# Patient Record
Sex: Female | Born: 1970 | Race: White | Hispanic: No | Marital: Married | Smoking: Never smoker
Health system: Southern US, Community
[De-identification: ages and names within clinical notes are randomized; demographics above are authoritative.]

## PROBLEM LIST (undated history)

## (undated) DIAGNOSIS — E785 Hyperlipidemia, unspecified: Secondary | ICD-10-CM

## (undated) DIAGNOSIS — I10 Essential (primary) hypertension: Secondary | ICD-10-CM

## (undated) DIAGNOSIS — F419 Anxiety disorder, unspecified: Secondary | ICD-10-CM

## (undated) HISTORY — PX: CHOLECYSTECTOMY: SHX55

## (undated) HISTORY — DX: Anxiety disorder, unspecified: F41.9

## (undated) HISTORY — DX: Hyperlipidemia, unspecified: E78.5

## (undated) HISTORY — PX: MASTOIDECTOMY: SHX711

## (undated) HISTORY — DX: Essential (primary) hypertension: I10

## (undated) HISTORY — PX: OTHER SURGICAL HISTORY: SHX169

## (undated) HISTORY — DX: Morbid (severe) obesity due to excess calories: E66.01

---

## 2002-03-22 ENCOUNTER — Ambulatory Visit (HOSPITAL_COMMUNITY): Admission: RE | Admit: 2002-03-22 | Discharge: 2002-03-22 | Payer: Self-pay | Admitting: Pulmonary Disease

## 2002-04-09 ENCOUNTER — Ambulatory Visit (HOSPITAL_COMMUNITY): Admission: RE | Admit: 2002-04-09 | Discharge: 2002-04-09 | Payer: Self-pay | Admitting: Pulmonary Disease

## 2002-04-30 ENCOUNTER — Observation Stay (HOSPITAL_COMMUNITY): Admission: RE | Admit: 2002-04-30 | Discharge: 2002-05-01 | Payer: Self-pay | Admitting: General Surgery

## 2006-07-17 ENCOUNTER — Emergency Department (HOSPITAL_COMMUNITY): Admission: EM | Admit: 2006-07-17 | Discharge: 2006-07-17 | Payer: Self-pay | Admitting: Emergency Medicine

## 2008-12-09 ENCOUNTER — Ambulatory Visit (HOSPITAL_COMMUNITY): Admission: RE | Admit: 2008-12-09 | Discharge: 2008-12-09 | Payer: Self-pay | Admitting: Pulmonary Disease

## 2008-12-17 ENCOUNTER — Ambulatory Visit (HOSPITAL_COMMUNITY): Admission: RE | Admit: 2008-12-17 | Discharge: 2008-12-17 | Payer: Self-pay | Admitting: Pulmonary Disease

## 2009-01-01 ENCOUNTER — Encounter (HOSPITAL_COMMUNITY): Admission: RE | Admit: 2009-01-01 | Discharge: 2009-01-31 | Payer: Self-pay | Admitting: Pulmonary Disease

## 2009-12-02 LAB — HM PAP SMEAR: HM Pap smear: NORMAL

## 2011-04-02 NOTE — Op Note (Signed)
[facility]  Patient:    Sharon Molina, Sharon Molina Visit Number: [id number] MRN: [id number]          Service Type: MED Location: 3A A321 01 Attending Physician:  Dalia Heading Dictated by:   Franky Macho, M.D. Proc. Date: 04/30/02 Admit Date:  04/30/2002 Discharge Date: 05/01/2002   CC:         Kari Baars, M.D.   Operative Report  PREOPERATIVE DIAGNOSES:   Chronic cholecystitis, ganglion cyst of right wrist.  POSTOPERATIVE DIAGNOSES:   Chronic cholecystitis, ganglion cyst of right wrist.  PROCEDURE:  Laparoscopic cholecystectomy, excision of ganglion cyst right wrist.  SURGEON:  Franky Macho, M.D.  ASSISTANT:  Arna Snipe, M.D.  ANESTHESIA:  General endotracheal.  INDICATIONS FOR PROCEDURE:  The patient is a 40 year old white female who presents with biliary colic secondary to chronic cholecystitis as well as a symptomatic right wrist ganglion cyst. The risks and benefits of both procedures including bleeding, infection, hepatobiliary injury, recurrence of the ganglion cyst, and the possibility of an open procedure were fully explained to the patient who gave informed consent.  DESCRIPTION OF PROCEDURE:  The patient was placed in the supine position. After induction of general endotracheal anesthesia, the right wrist was prepped and draped using the usual sterile technique with Betadine after confirmation of surgical site was performed.  A longitudinal incision was made over the ganglion cyst of the right wrist. This was taken down to the base of the ganglion cyst. A 5-0 Vicryl was placed along the base of the ganglion cyst and the ganglion cyst was excised without difficulty. A tourniquet was used at the beginning of the case. This was taken down and no significant bleeding was noted at the end of the procedure. The right radial artery was noted to be intact and pulsatile. The skin was closed using a 5-0 Vicryl subcuticular suture.  Steri-Strips and a dry sterile dressing were applied.  Next, the abdomen was prepped and draped using the usual sterile technique with Betadine. An infraumbilical incision was made down to the fascia. The Veress needle was introduced into the abdominal cavity and confirmation of placement was done using the saline drop test. The abdomen was then insufflated to 16 mmHg pressure. An 11 mm trocar was introduced into the abdominal cavity under direct visualization without visualization. The patient was placed in reverse Trendelenburg position and an additional 11 mm trocar was placed in the epigastric region and 5 mm trocars were placed in the right upper quadrant and right flank regions. The liver was inspected and noted to be within normal limits. The gallbladder was retracted superior and laterally. The dissection was begun around the infundibulum of the gallbladder. The cystic duct was first identified. Its juncture to the infundibulum fully identified. The endoclips were placed proximally and distally on the cystic duct and the cystic duct was divided. This was likewise done on the cystic artery. The gallbladder was then freed away from the gallbladder fossa using Bovie electrocautery. The gallbladder was delivered through the epigastric trocar site without difficulty. The gallbladder fossa was inspected and no abnormal bleeding or bile leakage was noted. All fluid and air were then evacuated from the abdominal cavity prior to removal of the trocars.  All wounds were irrigated with normal saline. All wounds were injected with 0.5% Sensorcaine. The infraumbilical fascia was reapproximated using an #0 Vicryl interrupted suture. The old skin incisions were closed using a 4-0 Vicryl subcuticular suture. Steri-Strips and dry sterile dressings were applied.  All tape and needle counts were correct at the end of the procedure. The patient was extubated in the operating room and went back to  the recovery room awake in stable condition.  COMPLICATIONS:  None.  SPECIMEN:  Gallbladder, ganglion cyst of right wrist.  ESTIMATED BLOOD LOSS:  Minimal.Dictated by:   Franky Macho, M.D.  Attending Physician:  Dalia Heading DD:  04/30/02 TD:  05/02/02 Job: [id number]

## 2011-04-02 NOTE — H&P (Signed)
[facility]  Patient:    Sharon Molina, Sharon Molina Visit Number: [id number] MRN: [id number]          Service Type: OUT Location: RAD Attending Physician:  Fredirick Maudlin Dictated by:   Franky Macho, M.D. Adm. Date:  04/30/02   CC:         Kari Baars, M.D.   History and Physical  AGE:  40 years old.  CHIEF COMPLAINT:  Chronic cholecystitis.  HISTORY OF PRESENT ILLNESS:  The patient is a 40 year old white female who is referred for evaluation and treatment of chronic cholecystitis.  She has been having intermittent episodes of right upper quadrant abdominal pain, nausea, bloating, and fatty food intolerance for many months.  It is now increasing in frequency and in nature.  No fever, chills, or jaundice have been noted.  PAST MEDICAL HISTORY:  Unremarkable.  PAST SURGICAL HISTORY:  Ear surgeries, and wisdom teeth removal.  CURRENT MEDICATIONS:  Birth control pills.  ALLERGIES:  SULFA DRUGS.  REVIEW OF SYSTEMS:  Unremarkable.  PHYSICAL EXAMINATION:  GENERAL:  On physical examination, the patient is a well-developed, well-nourished white female in no acute distress.  VITAL SIGNS:  She is afebrile and vital signs are stable.  HEENT:  No scleral icterus.  LUNGS:  Lungs are clear to auscultation with equal breath sounds bilaterally.  HEART:  Examination reveals a regular rate and rhythm without S3, S4, or murmurs.  ABDOMEN:  The abdomen is soft, nontender, and nondistended.  No hepatosplenomegaly, masses, or herniae are identified.  LABORATORY AND ACCESSORY DATA:  Ultrasound of the gallbladder was negative.  Hepatobiliary scan reveals chronic cholecystitis with a low gallbladder ejection fraction.  IMPRESSION:  Chronic cholecystitis.  PLAN:  The patient is scheduled for a laparoscopic cholecystectomy on April 30, 2002.  The risks and benefits of the procedure including bleeding, infection, hepatobiliary injury, and the  possibility of an open procedure were fully explained to the patient, who gave informed consent. Dictated by:   Franky Macho, M.D. Attending Physician:  Fredirick Maudlin DD:  04/13/02 TD:  04/13/02 Job: [id number]

## 2011-04-29 ENCOUNTER — Ambulatory Visit (INDEPENDENT_AMBULATORY_CARE_PROVIDER_SITE_OTHER): Payer: 59 | Admitting: Orthopedic Surgery

## 2011-04-29 ENCOUNTER — Encounter: Payer: Self-pay | Admitting: Orthopedic Surgery

## 2011-04-29 VITALS — Resp 18 | Ht 60.0 in | Wt 158.0 lb

## 2011-04-29 DIAGNOSIS — M629 Disorder of muscle, unspecified: Secondary | ICD-10-CM

## 2011-04-29 DIAGNOSIS — M7631 Iliotibial band syndrome, right leg: Secondary | ICD-10-CM | POA: Insufficient documentation

## 2011-04-29 NOTE — Progress Notes (Signed)
X-ray reports  AP lateral and tangential view patella RIGHT knee  The patella is well centered.  Overall joint alignment is normal.  No fracture or dislocation seen.  No joint effusion.  Impression normal knee.  Addendum we do see the slightest marginal spur of the medial tibial joint line

## 2011-04-29 NOTE — Patient Instructions (Signed)
Ice for 30 min after your shift  Max freeze 3 x a day apply to area  Take ibuprofen 800 mg 3 x a day for 2 weeks

## 2011-04-29 NOTE — Progress Notes (Signed)
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  It seems to be worse when she's walking up and down inclines or sudden change in position.  Of sitting and driving.  She reports swelling along the anterolateral portion of the knee just below the kneecap and just to the lateral side over the iliotibial band.  She reports some locking episodes.  There was no trauma but she was Hiking And after that things seemed to get worse.  Review of systems palpitations of the heart to heart burn no numbness no tingling no anxiety easy bruising or bleeding or shortness of breath  History recorded as above  Vital signs are stable as recorded  General appearance is normal  The patient is alert and oriented x3  The patient's mood and affect are normal  The patient is ambulating with a normal gait pattern  The cardiovascular exam reveals normal pulses and temperature without edema swelling.  The lymphatic system is negative for palpable lymph nodes  The sensory exam is normal.  There are no pathologic reflexes.  Balance is normal.  Body area: LEFT knee inspection normal.  Range of motion full.  Knee stability normal.  Strength normal.  Skin normal.  RIGHT knee inspection swelling and tenderness over GERD he is tubercle range of motion full, stability normal.Strength normal skin normal  Provocative tests McMurray sign negative bilaterally  Quadriceps active test normal bilaterally  Medial lateral facets nontender  X-rays were normal there is a marginal spur very small medial joint line.  Does not seem to be related to this illness or knee problem.  Impression iliotibial band syndrome.

## 2011-05-11 ENCOUNTER — Ambulatory Visit (HOSPITAL_COMMUNITY)
Admission: RE | Admit: 2011-05-11 | Discharge: 2011-05-11 | Disposition: A | Discharge status: Home / Self Care | Payer: 59 | Source: Ambulatory Visit | Attending: Orthopedic Surgery | Admitting: Orthopedic Surgery

## 2011-05-11 DIAGNOSIS — M25669 Stiffness of unspecified knee, not elsewhere classified: Secondary | ICD-10-CM | POA: Insufficient documentation

## 2011-05-11 DIAGNOSIS — R262 Difficulty in walking, not elsewhere classified: Secondary | ICD-10-CM | POA: Insufficient documentation

## 2011-05-11 DIAGNOSIS — M25469 Effusion, unspecified knee: Secondary | ICD-10-CM | POA: Insufficient documentation

## 2011-05-11 DIAGNOSIS — M25569 Pain in unspecified knee: Secondary | ICD-10-CM | POA: Insufficient documentation

## 2011-05-11 DIAGNOSIS — M6281 Muscle weakness (generalized): Secondary | ICD-10-CM | POA: Insufficient documentation

## 2011-05-11 DIAGNOSIS — IMO0001 Reserved for inherently not codable concepts without codable children: Secondary | ICD-10-CM | POA: Insufficient documentation

## 2011-05-13 ENCOUNTER — Ambulatory Visit (INDEPENDENT_AMBULATORY_CARE_PROVIDER_SITE_OTHER): Payer: 59 | Admitting: Orthopedic Surgery

## 2011-05-13 ENCOUNTER — Encounter: Payer: Self-pay | Admitting: Orthopedic Surgery

## 2011-05-13 ENCOUNTER — Ambulatory Visit (HOSPITAL_COMMUNITY)
Admission: RE | Admit: 2011-05-13 | Discharge: 2011-05-13 | Disposition: A | Discharge status: Home / Self Care | Payer: 59 | Source: Ambulatory Visit | Attending: *Deleted | Admitting: *Deleted

## 2011-05-13 DIAGNOSIS — M25569 Pain in unspecified knee: Secondary | ICD-10-CM

## 2011-05-13 NOTE — Patient Instructions (Signed)
You have received a steroid shot. 15% of patients experience increased pain at the injection site with in the next 24 hours. This is best treated with ice and tylenol extra strength 2 tabs every 8 hours. If you are still having pain please call the office.    

## 2011-05-17 ENCOUNTER — Encounter: Payer: Self-pay | Admitting: Orthopedic Surgery

## 2011-05-17 NOTE — Progress Notes (Signed)
Impression iliotibial band syndrome.4/anterior knee pain syndrome Treatment with physical therapy and oral anti-inflammatories  Patient  continues to have pain in the front of the knee associated with stair climbing and knee bending  After examination and evaluation shot was placed into the RIGHT knee joint.  Verbal consent was obtained   Time out completed   Under sterile conditions the right knee was injected with Depomedrol 40 mg / ml (1 ml) and lidocaine 1% (4 ml)  There were no complications  Continue therapy

## 2011-05-18 ENCOUNTER — Ambulatory Visit (HOSPITAL_COMMUNITY)
Admission: RE | Admit: 2011-05-18 | Discharge: 2011-05-18 | Disposition: A | Discharge status: Home / Self Care | Payer: 59 | Source: Ambulatory Visit | Attending: Orthopedic Surgery | Admitting: Orthopedic Surgery

## 2011-05-18 DIAGNOSIS — M25469 Effusion, unspecified knee: Secondary | ICD-10-CM | POA: Insufficient documentation

## 2011-05-18 DIAGNOSIS — IMO0001 Reserved for inherently not codable concepts without codable children: Secondary | ICD-10-CM | POA: Insufficient documentation

## 2011-05-18 DIAGNOSIS — M25569 Pain in unspecified knee: Secondary | ICD-10-CM | POA: Insufficient documentation

## 2011-05-18 DIAGNOSIS — R262 Difficulty in walking, not elsewhere classified: Secondary | ICD-10-CM | POA: Insufficient documentation

## 2011-05-18 DIAGNOSIS — M25669 Stiffness of unspecified knee, not elsewhere classified: Secondary | ICD-10-CM | POA: Insufficient documentation

## 2011-05-18 DIAGNOSIS — M6281 Muscle weakness (generalized): Secondary | ICD-10-CM | POA: Insufficient documentation

## 2011-05-20 ENCOUNTER — Ambulatory Visit (HOSPITAL_COMMUNITY): Payer: 59 | Admitting: Physical Therapy

## 2011-06-02 ENCOUNTER — Encounter: Payer: Self-pay | Admitting: Family Medicine

## 2011-06-08 ENCOUNTER — Ambulatory Visit (INDEPENDENT_AMBULATORY_CARE_PROVIDER_SITE_OTHER): Payer: 59 | Admitting: Orthopedic Surgery

## 2011-06-08 DIAGNOSIS — M25569 Pain in unspecified knee: Secondary | ICD-10-CM

## 2011-06-08 NOTE — Progress Notes (Signed)
Followup  Anterior knee pain.  Improved after therapy, and intra-articular injection.  The exam today, normal.  Follow up as needed

## 2011-09-23 ENCOUNTER — Other Ambulatory Visit (HOSPITAL_COMMUNITY): Payer: Self-pay | Admitting: Pulmonary Disease

## 2011-09-23 DIAGNOSIS — Z139 Encounter for screening, unspecified: Secondary | ICD-10-CM

## 2011-09-30 ENCOUNTER — Ambulatory Visit (HOSPITAL_COMMUNITY)
Admission: RE | Admit: 2011-09-30 | Discharge: 2011-09-30 | Disposition: A | Discharge status: Home / Self Care | Payer: 59 | Source: Ambulatory Visit | Attending: Pulmonary Disease | Admitting: Pulmonary Disease

## 2011-09-30 DIAGNOSIS — Z1231 Encounter for screening mammogram for malignant neoplasm of breast: Secondary | ICD-10-CM | POA: Insufficient documentation

## 2011-09-30 DIAGNOSIS — Z139 Encounter for screening, unspecified: Secondary | ICD-10-CM

## 2011-10-08 ENCOUNTER — Other Ambulatory Visit: Payer: Self-pay | Admitting: Pulmonary Disease

## 2011-10-08 DIAGNOSIS — R928 Other abnormal and inconclusive findings on diagnostic imaging of breast: Secondary | ICD-10-CM

## 2011-11-03 ENCOUNTER — Ambulatory Visit (HOSPITAL_COMMUNITY)
Admission: RE | Admit: 2011-11-03 | Discharge: 2011-11-03 | Disposition: A | Discharge status: Home / Self Care | Payer: 59 | Source: Ambulatory Visit | Attending: Pulmonary Disease | Admitting: Pulmonary Disease

## 2011-11-03 ENCOUNTER — Other Ambulatory Visit: Payer: Self-pay | Admitting: Pulmonary Disease

## 2011-11-03 ENCOUNTER — Other Ambulatory Visit (HOSPITAL_COMMUNITY): Payer: Self-pay | Admitting: Pulmonary Disease

## 2011-11-03 DIAGNOSIS — R928 Other abnormal and inconclusive findings on diagnostic imaging of breast: Secondary | ICD-10-CM

## 2011-11-03 DIAGNOSIS — N6009 Solitary cyst of unspecified breast: Secondary | ICD-10-CM | POA: Insufficient documentation

## 2012-04-17 ENCOUNTER — Other Ambulatory Visit (HOSPITAL_COMMUNITY)
Admission: RE | Admit: 2012-04-17 | Discharge: 2012-04-17 | Disposition: A | Discharge status: Home / Self Care | Payer: 59 | Source: Ambulatory Visit | Attending: Obstetrics & Gynecology | Admitting: Obstetrics & Gynecology

## 2012-04-17 ENCOUNTER — Other Ambulatory Visit: Payer: Self-pay | Admitting: Obstetrics & Gynecology

## 2012-04-17 DIAGNOSIS — Z01419 Encounter for gynecological examination (general) (routine) without abnormal findings: Secondary | ICD-10-CM | POA: Insufficient documentation

## 2013-05-02 ENCOUNTER — Other Ambulatory Visit (HOSPITAL_BASED_OUTPATIENT_CLINIC_OR_DEPARTMENT_OTHER): Payer: Self-pay | Admitting: Pulmonary Disease

## 2013-05-02 DIAGNOSIS — Z139 Encounter for screening, unspecified: Secondary | ICD-10-CM

## 2013-05-08 ENCOUNTER — Ambulatory Visit (HOSPITAL_COMMUNITY)
Admission: RE | Admit: 2013-05-08 | Discharge: 2013-05-08 | Disposition: A | Discharge status: Home / Self Care | Payer: 59 | Source: Ambulatory Visit | Attending: Pulmonary Disease | Admitting: Pulmonary Disease

## 2013-05-08 DIAGNOSIS — Z139 Encounter for screening, unspecified: Secondary | ICD-10-CM

## 2013-05-08 DIAGNOSIS — Z1231 Encounter for screening mammogram for malignant neoplasm of breast: Secondary | ICD-10-CM | POA: Insufficient documentation

## 2013-06-13 IMAGING — US US BREAST*R*
1 series · 4 of 4 positions shown · non-contrast
Comparison: Screening mammogram dated 09/30/2011

CLINICAL DATA: Abnormal right screening mammogram

DIGITAL DIAGNOSTIC RIGHT MAMMOGRAM  AND RIGHT BREAST ULTRASOUND:

[Series 1: us breast*right* · 0.09mm/px · 4 of 4 slices shown]
[im 1/4]
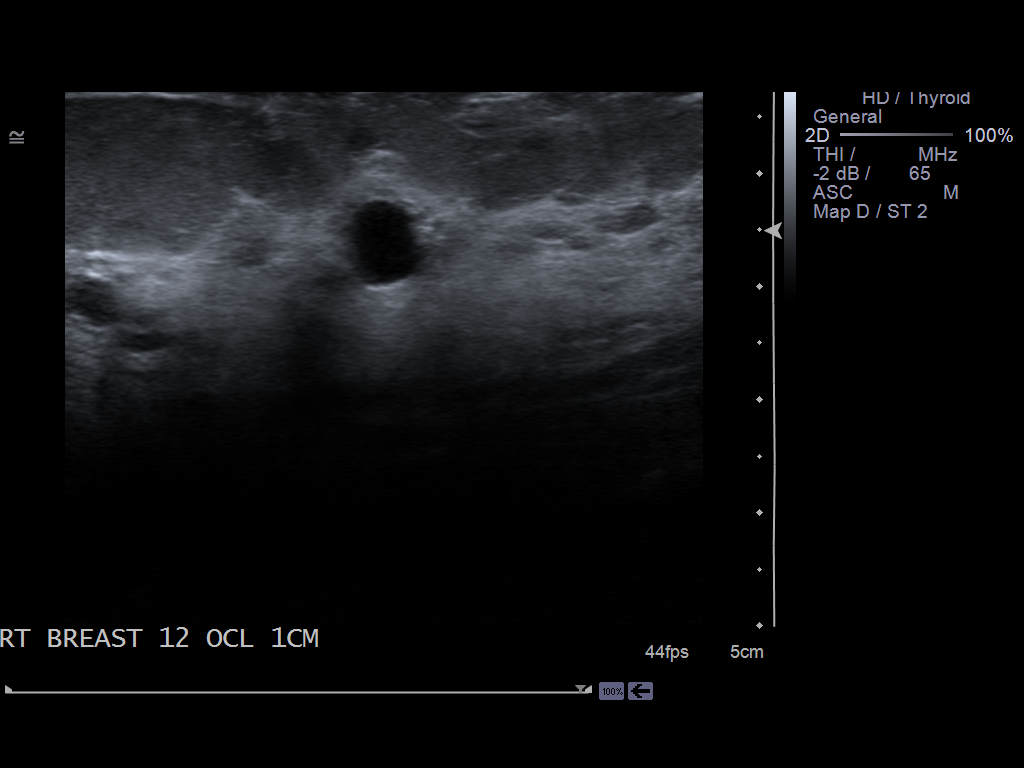
[im 2/4]
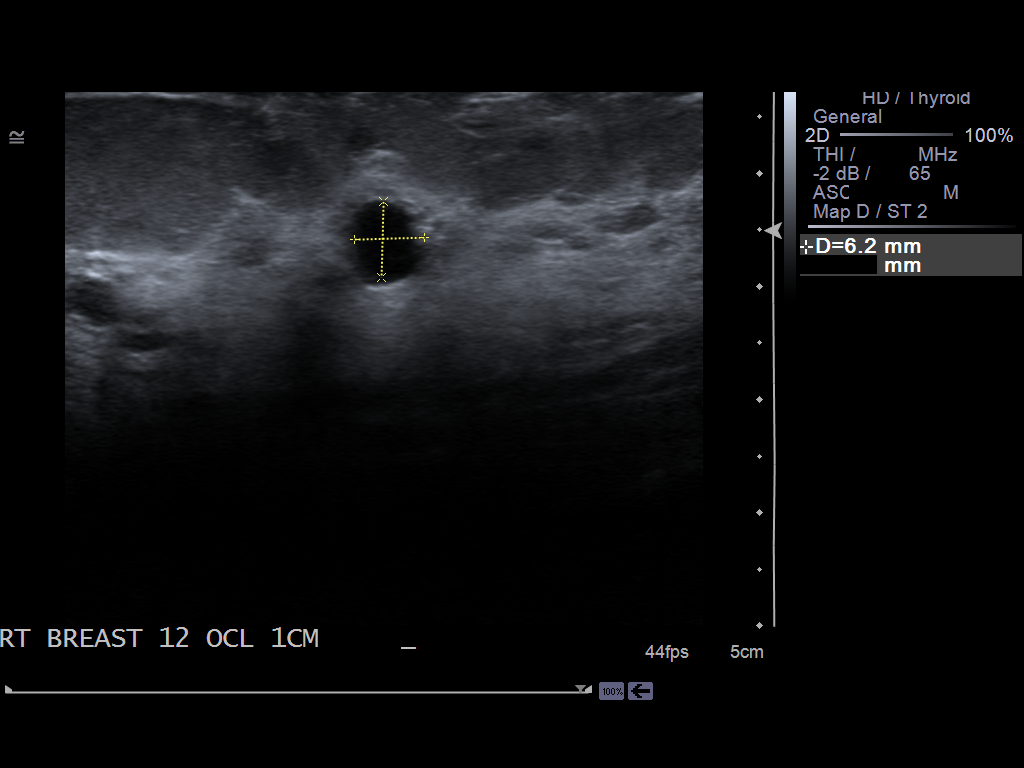
[im 3/4]
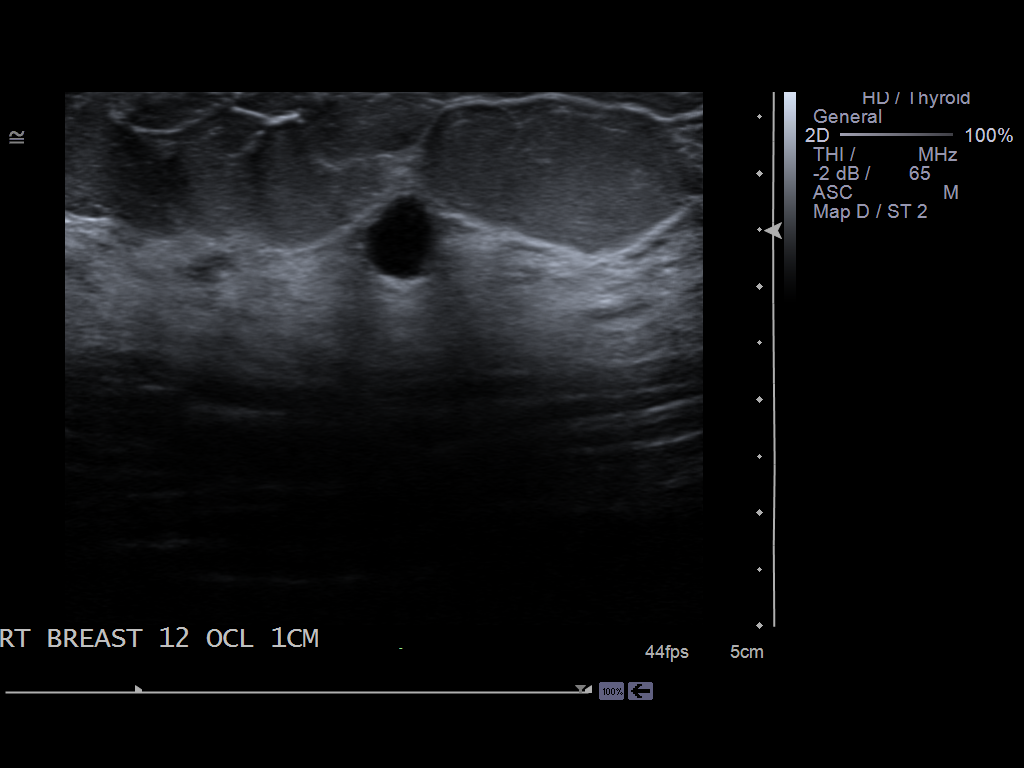
[im 4/4]
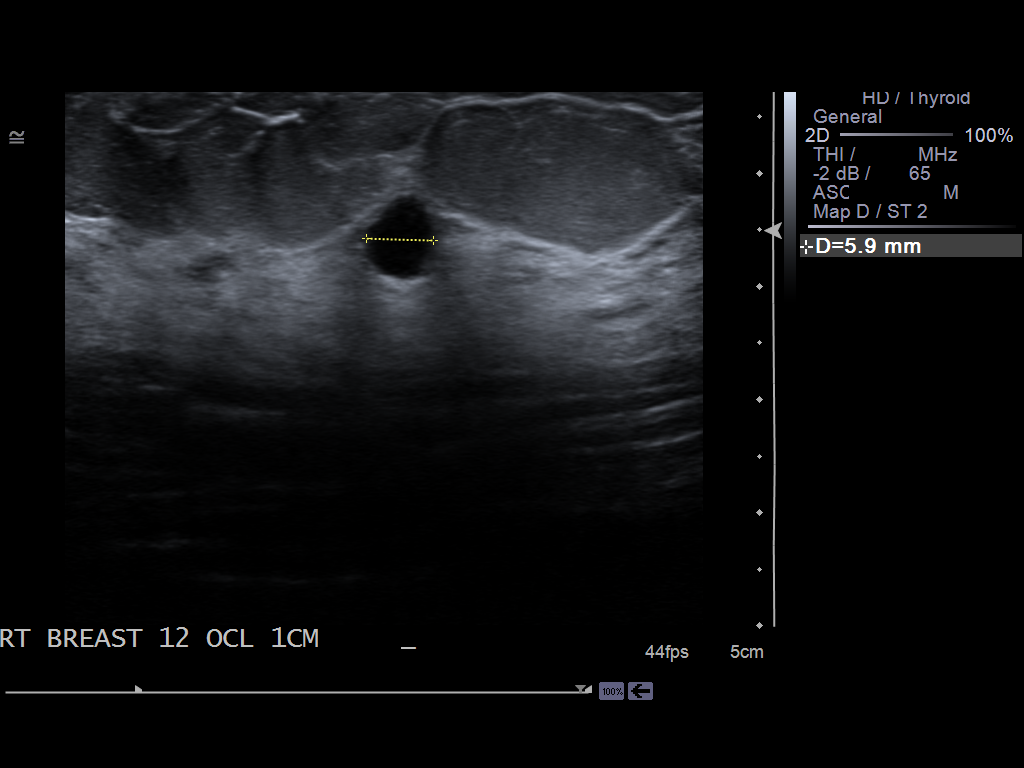

[4 of 4 positions shown; findings below may reference images not displayed]

FINDINGS: Spot compression views of the 12 o'clock subareolar
region of the right breast were performed.  There is persistence of
an obscured 11 mm mass.  There is no associated malignant-type
microcalcifications.

On physical exam, I do not palpate a mass in the right breast.

Ultrasound is performed, showing there is a simple cyst in the
right breast at 12 o'clock 1 cm from the nipple measuring 6 x 7 x 6
mm.  There is no solid mass.
IMPRESSION: Right breast cyst.  No evidence of malignancy.  Screening mammogram
in 1 year is recommended.

BI-RADS CATEGORY 2:  Benign finding(s).

## 2014-05-21 ENCOUNTER — Encounter: Payer: Self-pay | Admitting: Obstetrics & Gynecology

## 2014-05-21 ENCOUNTER — Other Ambulatory Visit (HOSPITAL_COMMUNITY)
Admission: RE | Admit: 2014-05-21 | Discharge: 2014-05-21 | Disposition: A | Discharge status: Home / Self Care | Payer: 59 | Source: Ambulatory Visit | Attending: Obstetrics & Gynecology | Admitting: Obstetrics & Gynecology

## 2014-05-21 ENCOUNTER — Ambulatory Visit (INDEPENDENT_AMBULATORY_CARE_PROVIDER_SITE_OTHER): Payer: 59 | Admitting: Obstetrics & Gynecology

## 2014-05-21 VITALS — BP 120/90 | Ht 59.4 in | Wt 169.0 lb

## 2014-05-21 DIAGNOSIS — Z01419 Encounter for gynecological examination (general) (routine) without abnormal findings: Secondary | ICD-10-CM | POA: Insufficient documentation

## 2014-05-21 DIAGNOSIS — Z1151 Encounter for screening for human papillomavirus (HPV): Secondary | ICD-10-CM | POA: Insufficient documentation

## 2014-05-21 NOTE — Progress Notes (Signed)
Patient ID: Sharon Rothmanammy C Steinhoff, female   DOB: Nov 03, 1971, 43 y.o.   MRN: [id number] Subjective:     Sharon Molina is a 43 y.o. female here for a routine exam.  Patient's last menstrual period was 05/01/2014. No obstetric history on file. Birth Control Method:  Vasectomy  Menstrual Calendar(currently): regular  Current complaints: none.   Current acute medical issues:  none   Recent Gynecologic History Patient's last menstrual period was 05/01/2014. Last Pap: 2014,  normal Last mammogram: 04/2013,  normal  History reviewed. No pertinent past medical history.  Past Surgical History  Procedure Laterality Date  . Tubes in ears    . Mastoidectomy      OB History   Grav Para Term Preterm Abortions TAB SAB Ect Mult Living                  History   Social History  . Marital Status: Married    Spouse Name: N/A    Number of Children: N/A  . Years of Education: N/A   Occupational History  . RN    Social History Main Topics  . Smoking status: Never Smoker   . Smokeless tobacco: None  . Alcohol Use: No  . Drug Use: No  . Sexual Activity: None   Other Topics Concern  . None   Social History Narrative  . None    Family History  Problem Relation Age of Onset  . Heart disease    . Cancer    . Hypertension Father   . Parkinsonism Father   . Kidney cancer Father   . Hypertension Mother      Review of Systems  Review of Systems  Constitutional: Negative for fever, chills, weight loss, malaise/fatigue and diaphoresis.  HENT: Negative for hearing loss, ear pain, nosebleeds, congestion, sore throat, neck pain, tinnitus and ear discharge.   Eyes: Negative for blurred vision, double vision, photophobia, pain, discharge and redness.  Respiratory: Negative for cough, hemoptysis, sputum production, shortness of breath, wheezing and stridor.   Cardiovascular: Negative for chest pain, palpitations, orthopnea, claudication, leg swelling and PND.  Gastrointestinal: negative  for abdominal pain. Negative for heartburn, nausea, vomiting, diarrhea, constipation, blood in stool and melena.  Genitourinary: Negative for dysuria, urgency, frequency, hematuria and flank pain.  Musculoskeletal: Negative for myalgias, back pain, joint pain and falls.  Skin: Negative for itching and rash.  Neurological: Negative for dizziness, tingling, tremors, sensory change, speech change, focal weakness, seizures, loss of consciousness, weakness and headaches.  Endo/Heme/Allergies: Negative for environmental allergies and polydipsia. Does not bruise/bleed easily.  Psychiatric/Behavioral: Negative for depression, suicidal ideas, hallucinations, memory loss and substance abuse. The patient is not nervous/anxious and does not have insomnia.        Objective:    Physical Exam  Vitals reviewed. Constitutional: She is oriented to person, place, and time. She appears well-developed and well-nourished.  HENT:  Head: Normocephalic and atraumatic.        Right Ear: External ear normal.  Left Ear: External ear normal.  Nose: Nose normal.  Mouth/Throat: Oropharynx is clear and moist.  Eyes: Conjunctivae and EOM are normal. Pupils are equal, round, and reactive to light. Right eye exhibits no discharge. Left eye exhibits no discharge. No scleral icterus.  Neck: Normal range of motion. Neck supple. No tracheal deviation present. No thyromegaly present.  Cardiovascular: Normal rate, regular rhythm, normal heart sounds and intact distal pulses.  Exam reveals no gallop and no friction rub.   No murmur heard.  Respiratory: Effort normal and breath sounds normal. No respiratory distress. She has no wheezes. She has no rales. She exhibits no tenderness.  GI: Soft. Bowel sounds are normal. She exhibits no distension and no mass. There is no tenderness. There is no rebound and no guarding.  Genitourinary:  Breasts no masses skin changes or nipple changes bilaterally      Vulva is normal without  lesions Vagina is pink moist without discharge Cervix normal in appearance and pap is done Uterus is normal size shape and contour Adnexa is negative with normal sized ovaries    Musculoskeletal: Normal range of motion. She exhibits no edema and no tenderness.  Neurological: She is alert and oriented to person, place, and time. She has normal reflexes. She displays normal reflexes. No cranial nerve deficit. She exhibits normal muscle tone. Coordination normal.  Skin: Skin is warm and dry. No rash noted. No erythema. No pallor.  Psychiatric: She has a normal mood and affect. Her behavior is normal. Judgment and thought content normal.       Assessment:    Healthy female exam.    Plan:    Follow up in: 1 year.

## 2014-05-22 LAB — CYTOLOGY - PAP

## 2015-06-05 ENCOUNTER — Other Ambulatory Visit: Payer: Self-pay | Admitting: Obstetrics & Gynecology

## 2015-06-05 DIAGNOSIS — Z1231 Encounter for screening mammogram for malignant neoplasm of breast: Secondary | ICD-10-CM

## 2015-06-11 ENCOUNTER — Ambulatory Visit (HOSPITAL_COMMUNITY)
Admission: RE | Admit: 2015-06-11 | Discharge: 2015-06-11 | Disposition: A | Discharge status: Home / Self Care | Payer: 59 | Source: Ambulatory Visit | Attending: Obstetrics & Gynecology | Admitting: Obstetrics & Gynecology

## 2015-06-11 DIAGNOSIS — Z1231 Encounter for screening mammogram for malignant neoplasm of breast: Secondary | ICD-10-CM | POA: Diagnosis not present

## 2015-06-27 ENCOUNTER — Other Ambulatory Visit: Payer: 59 | Admitting: Obstetrics & Gynecology

## 2015-07-10 ENCOUNTER — Encounter: Payer: Self-pay | Admitting: Obstetrics & Gynecology

## 2015-07-10 ENCOUNTER — Other Ambulatory Visit (HOSPITAL_COMMUNITY)
Admission: RE | Admit: 2015-07-10 | Discharge: 2015-07-10 | Disposition: A | Discharge status: Home / Self Care | Payer: 59 | Source: Ambulatory Visit | Attending: Obstetrics & Gynecology | Admitting: Obstetrics & Gynecology

## 2015-07-10 ENCOUNTER — Ambulatory Visit (INDEPENDENT_AMBULATORY_CARE_PROVIDER_SITE_OTHER): Payer: 59 | Admitting: Obstetrics & Gynecology

## 2015-07-10 VITALS — BP 140/80 | HR 72 | Ht 59.0 in | Wt 165.0 lb

## 2015-07-10 DIAGNOSIS — Z01419 Encounter for gynecological examination (general) (routine) without abnormal findings: Secondary | ICD-10-CM | POA: Diagnosis not present

## 2015-07-10 DIAGNOSIS — Z1151 Encounter for screening for human papillomavirus (HPV): Secondary | ICD-10-CM | POA: Insufficient documentation

## 2015-07-10 DIAGNOSIS — Z01411 Encounter for gynecological examination (general) (routine) with abnormal findings: Secondary | ICD-10-CM | POA: Insufficient documentation

## 2015-07-10 NOTE — Progress Notes (Signed)
Patient ID: Sharon Molina, female   DOB: 26-Mar-1971, 44 y.o.   MRN: [id number] Subjective:     Sharon Molina is a 44 y.o. female here for a routine exam.  Patient's last menstrual period was 06/25/2015. No obstetric history on file. Birth Control Method:  vasectomy Menstrual Calendar(currently): regular  Current complaints: no problems.   Current acute medical issues:  none   Recent Gynecologic History Patient's last menstrual period was 06/25/2015. Last Pap: 2015,  normal Last mammogram: 8/16,  normal  History reviewed. No pertinent past medical history.  Past Surgical History  Procedure Laterality Date  . Tubes in ears    . Mastoidectomy      OB History    No data available      Social History   Social History  . Marital Status: Married    Spouse Name: N/A  . Number of Children: N/A  . Years of Education: N/A   Occupational History  . RN    Social History Main Topics  . Smoking status: Never Smoker   . Smokeless tobacco: None  . Alcohol Use: No  . Drug Use: No  . Sexual Activity: Not Asked   Other Topics Concern  . None   Social History Narrative    Family History  Problem Relation Age of Onset  . Heart disease    . Cancer    . Hypertension Father   . Parkinsonism Father   . Kidney cancer Father   . Hypertension Mother     No current outpatient prescriptions on file.  Review of Systems  Review of Systems  Constitutional: Negative for fever, chills, weight loss, malaise/fatigue and diaphoresis.  HENT: Negative for hearing loss, ear pain, nosebleeds, congestion, sore throat, neck pain, tinnitus and ear discharge.   Eyes: Negative for blurred vision, double vision, photophobia, pain, discharge and redness.  Respiratory: Negative for cough, hemoptysis, sputum production, shortness of breath, wheezing and stridor.   Cardiovascular: Negative for chest pain, palpitations, orthopnea, claudication, leg swelling and PND.  Gastrointestinal:  negative for abdominal pain. Negative for heartburn, nausea, vomiting, diarrhea, constipation, blood in stool and melena.  Genitourinary: Negative for dysuria, urgency, frequency, hematuria and flank pain.  Musculoskeletal: Negative for myalgias, back pain, joint pain and falls.  Skin: Negative for itching and rash.  Neurological: Negative for dizziness, tingling, tremors, sensory change, speech change, focal weakness, seizures, loss of consciousness, weakness and headaches.  Endo/Heme/Allergies: Negative for environmental allergies and polydipsia. Does not bruise/bleed easily.  Psychiatric/Behavioral: Negative for depression, suicidal ideas, hallucinations, memory loss and substance abuse. The patient is not nervous/anxious and does not have insomnia.        Objective:  Blood pressure 140/80, pulse 72, height  (1.499 m), weight 165 lb (74.844 kg), last menstrual period 06/25/2015.   Physical Exam  Vitals reviewed. Constitutional: She is oriented to person, place, and time. She appears well-developed and well-nourished.  HENT:  Head: Normocephalic and atraumatic.        Right Ear: External ear normal.  Left Ear: External ear normal.  Nose: Nose normal.  Mouth/Throat: Oropharynx is clear and moist.  Eyes: Conjunctivae and EOM are normal. Pupils are equal, round, and reactive to light. Right eye exhibits no discharge. Left eye exhibits no discharge. No scleral icterus.  Neck: Normal range of motion. Neck supple. No tracheal deviation present. No thyromegaly present.  Cardiovascular: Normal rate, regular rhythm, normal heart sounds and intact distal pulses.  Exam reveals no gallop and no friction rub.  No murmur heard. Respiratory: Effort normal and breath sounds normal. No respiratory distress. She has no wheezes. She has no rales. She exhibits no tenderness.  GI: Soft. Bowel sounds are normal. She exhibits no distension and no mass. There is no tenderness. There is no rebound and no  guarding.  Genitourinary:  Breasts no masses skin changes or nipple changes bilaterally      Vulva is normal without lesions Vagina is pink moist without discharge Cervix normal in appearance and pap is done Uterus is normal size shape and contour Adnexa is negative with normal sized ovaries   Musculoskeletal: Normal range of motion. She exhibits no edema and no tenderness.  Neurological: She is alert and oriented to person, place, and time. She has normal reflexes. She displays normal reflexes. No cranial nerve deficit. She exhibits normal muscle tone. Coordination normal.  Skin: Skin is warm and dry. No rash noted. No erythema. No pallor.  Psychiatric: She has a normal mood and affect. Her behavior is normal. Judgment and thought content normal.       Assessment:    Healthy female exam.    Plan:    Contraception: vasectomy. Mammogram ordered. Follow up in: 1 years.

## 2015-07-14 LAB — CYTOLOGY - PAP

## 2015-11-26 DIAGNOSIS — H7011 Chronic mastoiditis, right ear: Secondary | ICD-10-CM | POA: Diagnosis not present

## 2016-08-18 ENCOUNTER — Ambulatory Visit (INDEPENDENT_AMBULATORY_CARE_PROVIDER_SITE_OTHER): Payer: Self-pay | Admitting: Obstetrics and Gynecology

## 2016-08-18 ENCOUNTER — Encounter: Payer: Self-pay | Admitting: Obstetrics and Gynecology

## 2016-08-18 DIAGNOSIS — L0889 Other specified local infections of the skin and subcutaneous tissue: Secondary | ICD-10-CM

## 2016-08-18 DIAGNOSIS — L723 Sebaceous cyst: Secondary | ICD-10-CM | POA: Diagnosis not present

## 2016-08-18 DIAGNOSIS — L089 Local infection of the skin and subcutaneous tissue, unspecified: Secondary | ICD-10-CM | POA: Insufficient documentation

## 2016-08-18 MED ORDER — DOXYCYCLINE HYCLATE 100 MG PO CAPS: 100.0000 mg | ORAL_CAPSULE | Freq: Two times a day (BID) | ORAL | 1 refills | Status: DC

## 2016-08-18 NOTE — Progress Notes (Addendum)
Patient ID: AYLENE ACOFF, female   DOB: Mar 17, 1971, 45 y.o.   MRN: [id number]    [facility] Visit  @DATE @            Patient name: Sharon Molina MRN [id number]  Date of birth: [birth year]  CC & HPI:   Chief Complaint  Patient presents with  . cyst inner leg     Sharon Molina is a 45 y.o. female presenting today for evaluation of an area of pain and swelling on her left inner thigh onset this week. Pt states she has been using warm compresses with some relief of pain. She notes h/o similar area 16 years prior.   ROS:  Review of Systems  Skin:       +pain, swelling left inner thigh   Pertinent History Reviewed:   Reviewed: Significant for HTN Medical         Past Medical History:  Diagnosis Date  . Hypertension                               Surgical Hx:    Past Surgical History:  Procedure Laterality Date  . CHOLECYSTECTOMY    . MASTOIDECTOMY    . tubes in ears     Medications: Reviewed & Updated - see associated section                      No current outpatient prescriptions on file.   Social History: Reviewed -  reports that she has never smoked. She has never used smokeless tobacco.  Objective Findings:  Vitals: Blood pressure (!) 160/104, pulse 92, height 4\' 11"  (1.499 m), weight 164 lb 3.2 oz (74.5 kg), last menstrual period 07/25/2016.  Physical Examination: General appearance - alert, well appearing, and in no distress Mental status - alert, oriented to person, place, and time Musculoskeletal - no joint tenderness, deformity or swelling. skin tag to right thigh that is asymptomatic, 1.5 cm x 2 cm hard indurated area on left inner thigh just beyond thigh crease that appears to be an infected sebaceous cyst    INCISION AND DRAINAGE PROCEDURE NOTE: 10060 Patient identification was confirmed and verbal consent was obtained. This procedure was performed by Tilda Burrow, MD at 10:43 AM. Site: left inner thigh  Sterile procedures  observed Needle size: 25 gauge  Anesthetic used (type and amt): 5 mL 2% lidocaine without epi  Blade size: 11 Drainage: tablespoon of purulent and waxy material  Complexity: Complex Packing used: none Site anesthetized, incision made over site, wound drained and explored loculations, covered with dry, sterile dressing.  Pt tolerated procedure well without complications.  Instructions for care discussed verbally and pt provided with additional written instructions for homecare and f/u.    Assessment & Plan:   A:  1. Infected sebaceous cyst left inner thigh, I&D recommended and performed in office  2. Wound care discussed, continue warm compress and apply neosporin  3. Asymptomatic skin tag to right thigh, stable   P:  1. If area recurs, plan to excise  2. Will rx Doxycycline 100 bid x 7d 3. F/u PRN    By signing my name below, I, Doreatha Martin, attest that this documentation has been prepared under the direction and in the presence of Tilda Burrow, MD. Electronically Signed: Doreatha Martin, ED Scribe. 08/18/16. 10:40 AM.  I personally performed the services described in this documentation, which  was SCRIBED in my presence. The recorded information has been reviewed and considered accurate. It has been edited as necessary during review. Tilda BurrowFERGUSON,Amante Fomby V, MD

## 2016-08-30 ENCOUNTER — Ambulatory Visit (INDEPENDENT_AMBULATORY_CARE_PROVIDER_SITE_OTHER): Payer: 59 | Admitting: Obstetrics & Gynecology

## 2016-08-30 ENCOUNTER — Encounter: Payer: Self-pay | Admitting: Obstetrics & Gynecology

## 2016-08-30 ENCOUNTER — Other Ambulatory Visit (HOSPITAL_COMMUNITY)
Admission: RE | Admit: 2016-08-30 | Discharge: 2016-08-30 | Disposition: A | Discharge status: Home / Self Care | Payer: 59 | Source: Ambulatory Visit | Attending: Obstetrics & Gynecology | Admitting: Obstetrics & Gynecology

## 2016-08-30 VITALS — BP 122/88 | HR 80 | Ht 59.0 in | Wt 164.0 lb

## 2016-08-30 DIAGNOSIS — Z01419 Encounter for gynecological examination (general) (routine) without abnormal findings: Secondary | ICD-10-CM | POA: Diagnosis not present

## 2016-08-30 DIAGNOSIS — Z01411 Encounter for gynecological examination (general) (routine) with abnormal findings: Secondary | ICD-10-CM | POA: Insufficient documentation

## 2016-08-30 NOTE — Progress Notes (Signed)
Subjective:     Sharon Molina is a 45 y.o. female here for a routine exam.  Patient's last menstrual period was 08/18/2016. No obstetric history on file. Birth Control Method:  vasectomy Menstrual Calendar(currently): regular  Current complaints: none.   Current acute medical issues:  Pap last year ASCUS negative HPV   Recent Gynecologic History Patient's last menstrual period was 08/18/2016. Last Pap: 07/2015,  As ablove Last mammogram: 05/2015,  normal  Past Medical History:  Diagnosis Date  . Hypertension     Past Surgical History:  Procedure Laterality Date  . CHOLECYSTECTOMY    . MASTOIDECTOMY    . tubes in ears      OB History    No data available      Social History   Social History  . Marital status: Married    Spouse name: N/A  . Number of children: N/A  . Years of education: N/A   Occupational History  . RN    Social History Main Topics  . Smoking status: Never Smoker  . Smokeless tobacco: Never Used  . Alcohol use No  . Drug use: No  . Sexual activity: Yes    Partners: Male    Birth control/ protection: Surgical   Other Topics Concern  . None   Social History Narrative  . None    Family History  Problem Relation Age of Onset  . Hypertension Father   . Parkinsonism Father   . Kidney cancer Father   . Hypertension Mother   . Cancer - Other Paternal Grandmother     brain  . Heart disease Paternal Grandfather      Current Outpatient Prescriptions:  .  amLODipine-atorvastatin (CADUET) 5-10 MG tablet, Take 1 tablet by mouth daily., Disp: , Rfl:   Review of Systems  Review of Systems  Constitutional: Negative for fever, chills, weight loss, malaise/fatigue and diaphoresis.  HENT: Negative for hearing loss, ear pain, nosebleeds, congestion, sore throat, neck pain, tinnitus and ear discharge.   Eyes: Negative for blurred vision, double vision, photophobia, pain, discharge and redness.  Respiratory: Negative for cough, hemoptysis,  sputum production, shortness of breath, wheezing and stridor.   Cardiovascular: Negative for chest pain, palpitations, orthopnea, claudication, leg swelling and PND.  Gastrointestinal: negative for abdominal pain. Negative for heartburn, nausea, vomiting, diarrhea, constipation, blood in stool and melena.  Genitourinary: Negative for dysuria, urgency, frequency, hematuria and flank pain.  Musculoskeletal: Negative for myalgias, back pain, joint pain and falls.  Skin: Negative for itching and rash.  Neurological: Negative for dizziness, tingling, tremors, sensory change, speech change, focal weakness, seizures, loss of consciousness, weakness and headaches.  Endo/Heme/Allergies: Negative for environmental allergies and polydipsia. Does not bruise/bleed easily.  Psychiatric/Behavioral: Negative for depression, suicidal ideas, hallucinations, memory loss and substance abuse. The patient is not nervous/anxious and does not have insomnia.        Objective:  Blood pressure 122/88, pulse 80, height 4\' 11"  (1.499 m), weight 164 lb (74.4 kg), last menstrual period 08/18/2016.   Physical Exam  Vitals reviewed. Constitutional: She is oriented to person, place, and time. She appears well-developed and well-nourished.  HENT:  Head: Normocephalic and atraumatic.        Right Ear: External ear normal.  Left Ear: External ear normal.  Nose: Nose normal.  Mouth/Throat: Oropharynx is clear and moist.  Eyes: Conjunctivae and EOM are normal. Pupils are equal, round, and reactive to light. Right eye exhibits no discharge. Left eye exhibits no discharge. No scleral icterus.  Neck: Normal range of motion. Neck supple. No tracheal deviation present. No thyromegaly present.  Cardiovascular: Normal rate, regular rhythm, normal heart sounds and intact distal pulses.  Exam reveals no gallop and no friction rub.   No murmur heard. Respiratory: Effort normal and breath sounds normal. No respiratory distress. She has no  wheezes. She has no rales. She exhibits no tenderness.  GI: Soft. Bowel sounds are normal. She exhibits no distension and no mass. There is no tenderness. There is no rebound and no guarding.  Genitourinary:  Breasts no masses skin changes or nipple changes bilaterally      Vulva is normal without lesions Vagina is pink moist without discharge Cervix normal in appearance and pap is done Uterus is normal size shape and contour Adnexa is negative with normal sized ovaries   Musculoskeletal: Normal range of motion. She exhibits no edema and no tenderness.  Neurological: She is alert and oriented to person, place, and time. She has normal reflexes. She displays normal reflexes. No cranial nerve deficit. She exhibits normal muscle tone. Coordination normal.  Skin: Skin is warm and dry. No rash noted. No erythema. No pallor.  Psychiatric: She has a normal mood and affect. Her behavior is normal. Judgment and thought content normal.       Medications Ordered at today's visit: Meds ordered this encounter  Medications  . amLODipine-atorvastatin (CADUET) 5-10 MG tablet    Sig: Take 1 tablet by mouth daily.    Other orders placed at today's visit: No orders of the defined types were placed in this encounter.     Assessment:    Healthy female exam.    Plan:    Mammogram ordered. Follow up in: 1 year.     Return in about 1 year (around 08/30/2017).

## 2016-09-01 LAB — CYTOLOGY - PAP: Diagnosis: NEGATIVE

## 2017-08-26 MED FILL — CHLORTHALIDONE 25 MG TABLET: 25 | 90 days supply | Qty: 90 | Fill #0

## 2017-10-20 MED FILL — AMLODIPINE BESYLATE 5 MG TA: 5 | 90 days supply | Qty: 90 | Fill #0

## 2017-11-28 MED FILL — CHLORTHALIDONE 25 MG TABS: 25 | 90 days supply | Qty: 90 | Fill #1

## 2017-12-07 DIAGNOSIS — E669 Obesity, unspecified: Secondary | ICD-10-CM | POA: Diagnosis not present

## 2017-12-07 DIAGNOSIS — I10 Essential (primary) hypertension: Secondary | ICD-10-CM | POA: Diagnosis not present

## 2018-01-11 DIAGNOSIS — H5213 Myopia, bilateral: Secondary | ICD-10-CM | POA: Diagnosis not present

## 2018-01-11 DIAGNOSIS — H52222 Regular astigmatism, left eye: Secondary | ICD-10-CM | POA: Diagnosis not present

## 2018-01-27 MED FILL — AMLODIPINE BESYLATE 5 MG TA: 5 | 90 days supply | Qty: 90 | Fill #1

## 2018-02-27 MED FILL — CHLORTHALIDONE 25 MG TAB: 25 | 90 days supply | Qty: 90 | Fill #2

## 2018-04-03 DIAGNOSIS — H7011 Chronic mastoiditis, right ear: Secondary | ICD-10-CM | POA: Diagnosis not present

## 2018-05-01 MED FILL — AMLODIPINE BESYLATE 5 MG TA: 5 | 90 days supply | Qty: 90 | Fill #2

## 2018-06-01 MED FILL — CHLORTHALIDONE 25 MG TAB: 25 | 90 days supply | Qty: 90 | Fill #3

## 2018-06-06 DIAGNOSIS — Z0000 Encounter for general adult medical examination without abnormal findings: Secondary | ICD-10-CM | POA: Diagnosis not present

## 2018-07-31 MED FILL — AMLODIPINE BESYLATE 5 MG TA: 5 | 90 days supply | Qty: 90 | Fill #3

## 2018-08-29 MED FILL — CHLORTHALIDONE 50 MG TAB: 50 | 90 days supply | Qty: 90 | Fill #0

## 2018-10-30 MED FILL — AMLODIPINE BESYLATE 5 MG TA: 5 | 90 days supply | Qty: 90 | Fill #0

## 2019-01-01 MED FILL — CLINDAMYCIN HCL 150 MG CAPS: 150 | 7 days supply | Qty: 28 | Fill #0

## 2019-01-26 MED FILL — AMLODIPINE BESYLATE 5 MG TA: 5 | 90 days supply | Qty: 90 | Fill #1

## 2019-02-21 MED FILL — CHLORTHALIDONE 50 MG TABLET: 50 | 90 days supply | Qty: 90 | Fill #0

## 2019-04-30 MED FILL — AMLODIPINE BESYLATE 5 MG TA: 5 | 90 days supply | Qty: 90 | Fill #2

## 2019-05-14 DIAGNOSIS — H52223 Regular astigmatism, bilateral: Secondary | ICD-10-CM | POA: Diagnosis not present

## 2019-05-14 DIAGNOSIS — H524 Presbyopia: Secondary | ICD-10-CM | POA: Diagnosis not present

## 2019-05-14 DIAGNOSIS — H5213 Myopia, bilateral: Secondary | ICD-10-CM | POA: Diagnosis not present

## 2019-05-24 DIAGNOSIS — H7011 Chronic mastoiditis, right ear: Secondary | ICD-10-CM | POA: Diagnosis not present

## 2019-07-30 MED FILL — AMLODIPINE BESYLATE 5 MG TA: 5 | 90 days supply | Qty: 90 | Fill #3

## 2019-08-06 DIAGNOSIS — I10 Essential (primary) hypertension: Secondary | ICD-10-CM | POA: Diagnosis not present

## 2019-08-06 DIAGNOSIS — E669 Obesity, unspecified: Secondary | ICD-10-CM | POA: Diagnosis not present

## 2019-08-06 DIAGNOSIS — F419 Anxiety disorder, unspecified: Secondary | ICD-10-CM | POA: Diagnosis not present

## 2019-08-06 MED FILL — ALPRAZolam 0.25 MG TABS: 0.25 | 15 days supply | Qty: 30 | Fill #0

## 2019-08-06 MED FILL — ESCITALOPRAM 10 MG TABLET: 10 | 30 days supply | Qty: 30 | Fill #0

## 2019-08-07 DIAGNOSIS — E669 Obesity, unspecified: Secondary | ICD-10-CM | POA: Diagnosis not present

## 2019-08-07 DIAGNOSIS — I10 Essential (primary) hypertension: Secondary | ICD-10-CM | POA: Diagnosis not present

## 2019-08-07 DIAGNOSIS — F419 Anxiety disorder, unspecified: Secondary | ICD-10-CM | POA: Diagnosis not present

## 2019-08-27 DIAGNOSIS — I10 Essential (primary) hypertension: Secondary | ICD-10-CM | POA: Diagnosis not present

## 2019-08-27 DIAGNOSIS — F419 Anxiety disorder, unspecified: Secondary | ICD-10-CM | POA: Diagnosis not present

## 2019-08-27 DIAGNOSIS — F321 Major depressive disorder, single episode, moderate: Secondary | ICD-10-CM | POA: Diagnosis not present

## 2019-08-27 MED FILL — CHLORTHALIDONE 50 MG TAB: 50 | 90 days supply | Qty: 90 | Fill #0

## 2019-08-30 DIAGNOSIS — Z23 Encounter for immunization: Secondary | ICD-10-CM | POA: Diagnosis not present

## 2019-08-30 MED FILL — ESCITALOPRAM 10 MG TABLET: 10 | 90 days supply | Qty: 90 | Fill #0

## 2019-11-06 MED FILL — AMLODIPINE BESYLATE 5 MG TA: 5 | 90 days supply | Qty: 90 | Fill #0

## 2019-11-23 ENCOUNTER — Ambulatory Visit: Payer: 59 | Admitting: Internal Medicine

## 2019-12-03 MED FILL — ESCITALOPRAM 10 MG TABLET: 10 | 90 days supply | Qty: 90 | Fill #1

## 2020-01-02 ENCOUNTER — Ambulatory Visit: Payer: 59 | Admitting: Internal Medicine

## 2020-02-01 MED FILL — AMLODIPINE BESYLATE 5 MG TA: 5 | 90 days supply | Qty: 90 | Fill #1

## 2020-02-19 ENCOUNTER — Other Ambulatory Visit: Payer: Self-pay

## 2020-02-20 ENCOUNTER — Encounter: Payer: Self-pay | Admitting: Internal Medicine

## 2020-02-20 ENCOUNTER — Ambulatory Visit: Payer: 59 | Admitting: Internal Medicine

## 2020-02-20 VITALS — BP 120/84 | HR 98 | Temp 97.7°F | Ht 59.0 in | Wt 185.6 lb

## 2020-02-20 DIAGNOSIS — F419 Anxiety disorder, unspecified: Secondary | ICD-10-CM | POA: Diagnosis not present

## 2020-02-20 DIAGNOSIS — I10 Essential (primary) hypertension: Secondary | ICD-10-CM

## 2020-02-20 DIAGNOSIS — E785 Hyperlipidemia, unspecified: Secondary | ICD-10-CM | POA: Insufficient documentation

## 2020-02-20 DIAGNOSIS — Z1211 Encounter for screening for malignant neoplasm of colon: Secondary | ICD-10-CM | POA: Diagnosis not present

## 2020-02-20 MED ORDER — AMLODIPINE BESYLATE 5 MG PO TABS: 5.0000 mg | ORAL_TABLET | Freq: Every day | ORAL | 3 refills | Status: DC

## 2020-02-20 NOTE — Patient Instructions (Signed)
-  Nice seeing you today!!  -See you back in 6 months for your annual physical. Please come in fasting that day.  -Mammogram referral today.

## 2020-02-20 NOTE — Progress Notes (Signed)
New Patient Office Visit     This visit occurred during the SARS-CoV-2 public health emergency.  Safety protocols were in place, including screening questions prior to the visit, additional usage of staff PPE, and extensive cleaning of exam room while observing appropriate contact time as indicated for disinfecting solutions.    CC/Reason for Visit: Establish care, discuss chronic medical conditions Previous PCP: Kari Baars, MD Last Visit: September 2020  HPI: Sharon Molina is a 49 y.o. female who is coming in today for the above mentioned reasons. Past Medical History is significant for: Hypertension that has been well controlled on amlodipine and chlorthalidone, anxiety well controlled on Lexapro, seasonal allergies on Zyrtec as well as vitamin D deficiency on 2000 international units of vitamin D daily.  She sees Dr.Eure with GYN but is now overdue for her Pap smear.  She received both Covid vaccines.  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  She has no acute complaints today.  She is overdue for screening mammogram and screening colonoscopy.   Past Medical/Surgical History: Past Medical History:  Diagnosis Date  . Anxiety   . Hyperlipidemia   . Hypertension   . Morbid obesity (HCC)     Past Surgical History:  Procedure Laterality Date  . CHOLECYSTECTOMY    . MASTOIDECTOMY    . tubes in ears      Social History:  reports that she has never smoked. She has never used smokeless tobacco. She reports that she does not drink alcohol or use drugs.  Allergies: Allergies  Allergen Reactions  . Demerol   . Lisinopril     Itching and swelling around eyes  . Sulfa Antibiotics     Family History:  Family History  Problem Relation Age of Onset  . Hypertension Father   . Parkinsonism Father   . Kidney cancer Father   . Hypertension Mother   . Cancer - Other Paternal Grandmother        brain  . Heart disease Paternal Grandfather        Current Outpatient Medications:  .  amLODipine-atorvastatin (CADUET) 5-10 MG tablet, Take 1 tablet by mouth daily., Disp: , Rfl:  .  cetirizine (ZYRTEC) 10 MG tablet, Take 10 mg by mouth daily., Disp: , Rfl:  .  chlorthalidone (HYGROTON) 25 MG tablet, Take by mouth., Disp: , Rfl:  .  Cholecalciferol (QC VITAMIN D3) 50 MCG (2000 UT) TABS, Take 1 tablet by mouth daily. , Disp: , Rfl:  .  escitalopram (LEXAPRO) 10 MG tablet, Take 10 mg by mouth daily., Disp: , Rfl:   Review of Systems:  Constitutional: Denies fever, chills, diaphoresis, appetite change and fatigue.  HEENT: Denies photophobia, eye pain, redness, hearing loss, ear pain, congestion, sore throat, rhinorrhea, sneezing, mouth sores, trouble swallowing, neck pain, neck stiffness and tinnitus.   Respiratory: Denies SOB, DOE, cough, chest tightness,  and wheezing.   Cardiovascular: Denies chest pain, palpitations and leg swelling.  Gastrointestinal: Denies nausea, vomiting, abdominal pain, diarrhea, constipation, blood in stool and abdominal distention.  Genitourinary: Denies dysuria, urgency, frequency, hematuria, flank pain and difficulty urinating.  Endocrine: Denies: hot or cold intolerance, sweats, changes in hair or nails, polyuria, polydipsia. Musculoskeletal: Denies myalgias, back pain, joint swelling, arthralgias and gait problem.  Skin: Denies pallor, rash and wound.  Neurological: Denies dizziness, seizures, syncope, weakness, light-headedness, numbness and headaches.  Hematological: Denies adenopathy. Easy bruising, personal or family bleeding history  Psychiatric/Behavioral: Denies suicidal ideation, mood  changes, confusion, nervousness, sleep disturbance and agitation    Physical Exam: Vitals:   02/20/20 0714  BP: 120/84  Pulse: 98  Temp: 97.7 F (36.5 C)  TempSrc: Temporal  SpO2: 97%  Weight: 185 lb 9.6 oz (84.2 kg)  Height: 4\' 11"  (1.499 m)   Body mass index is 37.49 kg/m.  Constitutional: NAD, calm,  comfortable Eyes: PERRL, lids and conjunctivae normal ENMT: Mucous membranes are moist.  Respiratory: clear to auscultation bilaterally, no wheezing, no crackles. Normal respiratory effort. No accessory muscle use.  Cardiovascular: Regular rate and rhythm, no murmurs / rubs / gallops. No extremity edema.  Neurologic: Grossly intact and nonfocal Psychiatric: Normal judgment and insight. Alert and oriented x 3. Normal mood.    Impression and Plan:  Screening for malignant neoplasm of colon  - Plan: Ambulatory referral to Gastroenterology  Breast cancer screening -MM Digital Screening  Essential hypertension -Well-controlled on current medications  Hyperlipidemia, unspecified hyperlipidemia type -Not on medications, check lipids when she returns for physical  Morbid obesity ([place]) -Discussed healthy lifestyle, including increased physical activity and better food choices to promote weight loss.  Anxiety -Well-controlled on Lexapro. -Check PHQ-9 next visit.    Patient Instructions  -Nice seeing you today!!  -See you back in 6 months for your annual physical. Please come in fasting that day.  -Mammogram referral today.     Lelon Frohlich, MD [facility]

## 2020-02-27 MED FILL — CHLORTHALIDONE 50 MG TAB: 50 | 90 days supply | Qty: 90 | Fill #1

## 2020-02-28 ENCOUNTER — Telehealth: Payer: Self-pay | Admitting: Internal Medicine

## 2020-02-28 MED ORDER — ESCITALOPRAM OXALATE 10 MG PO TABS: 10.0000 mg | ORAL_TABLET | Freq: Every day | ORAL | 1 refills | Status: DC

## 2020-02-28 MED FILL — ESCITALOPRAM 10 MG TABLET: 10 | 90 days supply | Qty: 90 | Fill #0

## 2020-02-28 NOTE — Telephone Encounter (Signed)
pt is requesting a refill for  escitalopram (LEXAPRO) 10 MG tablet [facility] - [place], [place] - [address] [facility].  Phone:  [phone] Fax:  [phone]

## 2020-05-08 MED FILL — AMLODIPINE BESYLATE 5 MG TA: 5 | 90 days supply | Qty: 90 | Fill #2

## 2020-05-30 MED FILL — ESCITALOPRAM 10 MG TABLET: 10 | 90 days supply | Qty: 90 | Fill #1

## 2020-08-07 MED FILL — AMLODIPINE BESYLATE 5 MG TA: 5 | 90 days supply | Qty: 90 | Fill #3

## 2020-08-21 ENCOUNTER — Encounter: Payer: 59 | Admitting: Internal Medicine

## 2020-08-29 ENCOUNTER — Other Ambulatory Visit: Payer: Self-pay | Admitting: Internal Medicine

## 2020-08-30 ENCOUNTER — Other Ambulatory Visit: Payer: Self-pay | Admitting: Internal Medicine

## 2020-09-01 MED FILL — ESCITALOPRAM 10 MG TABLET: 10 | 90 days supply | Qty: 90 | Fill #0

## 2020-09-02 ENCOUNTER — Encounter: Payer: 59 | Admitting: Internal Medicine

## 2020-09-04 ENCOUNTER — Other Ambulatory Visit: Payer: Self-pay | Admitting: Internal Medicine

## 2020-09-04 MED FILL — CHLORTHALIDONE 50 MG TAB: 50 | 90 days supply | Qty: 90 | Fill #0

## 2020-09-24 ENCOUNTER — Encounter: Payer: Self-pay | Admitting: Internal Medicine

## 2020-09-24 ENCOUNTER — Ambulatory Visit (INDEPENDENT_AMBULATORY_CARE_PROVIDER_SITE_OTHER): Payer: Managed Care, Other (non HMO) | Admitting: Internal Medicine

## 2020-09-24 ENCOUNTER — Other Ambulatory Visit: Payer: Self-pay

## 2020-09-24 VITALS — BP 110/84 | HR 102 | Temp 98.5°F | Ht 60.0 in | Wt 190.3 lb

## 2020-09-24 DIAGNOSIS — Z23 Encounter for immunization: Secondary | ICD-10-CM

## 2020-09-24 DIAGNOSIS — Z0000 Encounter for general adult medical examination without abnormal findings: Secondary | ICD-10-CM | POA: Diagnosis not present

## 2020-09-24 DIAGNOSIS — F419 Anxiety disorder, unspecified: Secondary | ICD-10-CM

## 2020-09-24 DIAGNOSIS — I10 Essential (primary) hypertension: Secondary | ICD-10-CM | POA: Diagnosis not present

## 2020-09-24 DIAGNOSIS — E785 Hyperlipidemia, unspecified: Secondary | ICD-10-CM | POA: Diagnosis not present

## 2020-09-24 NOTE — Addendum Note (Signed)
Addended by: Kern Reap B on: 09/24/2020 05:26 PM   Modules accepted: Orders

## 2020-09-24 NOTE — Patient Instructions (Signed)
-Nice seeing you today!!  -TDap today.  -COVID Booster and shingles vaccines are pending.  -Due for PAP, mammogram and colonoscopy. Let us know when we can help you schedule.  -Lab work pending. Let me know where to fax orders to.  -Schedule follow up in 6 months.   Preventive Care 4-50 Years Old, Female Preventive care refers to visits with your health care provider and lifestyle choices that can promote health and wellness. This includes:  A yearly physical exam. This may also be called an annual well check.  Regular dental visits and eye exams.  Immunizations.  Screening for certain conditions.  Healthy lifestyle choices, such as eating a healthy diet, getting regular exercise, not using drugs or products that contain nicotine and tobacco, and limiting alcohol use. What can I expect for my preventive care visit? Physical exam Your health care provider will check your:  Height and weight. This may be used to calculate body mass index (BMI), which tells if you are at a healthy weight.  Heart rate and blood pressure.  Skin for abnormal spots. Counseling Your health care provider may ask you questions about your:  Alcohol, tobacco, and drug use.  Emotional well-being.  Home and relationship well-being.  Sexual activity.  Eating habits.  Work and work Statistician.  Method of birth control.  Menstrual cycle.  Pregnancy history. What immunizations do I need?  Influenza (flu) vaccine  This is recommended every year. Tetanus, diphtheria, and pertussis (Tdap) vaccine  You may need a Td booster every 10 years. Varicella (chickenpox) vaccine  You may need this if you have not been vaccinated. Zoster (shingles) vaccine  You may need this after age 8. Measles, mumps, and rubella (MMR) vaccine  You may need at least one dose of MMR if you were born in 1957 or later. You may also need a second dose. Pneumococcal conjugate (PCV13) vaccine  You may need  this if you have certain conditions and were not previously vaccinated. Pneumococcal polysaccharide (PPSV23) vaccine  You may need one or two doses if you smoke cigarettes or if you have certain conditions. Meningococcal conjugate (MenACWY) vaccine  You may need this if you have certain conditions. Hepatitis A vaccine  You may need this if you have certain conditions or if you travel or work in places where you may be exposed to hepatitis A. Hepatitis B vaccine  You may need this if you have certain conditions or if you travel or work in places where you may be exposed to hepatitis B. Haemophilus influenzae type b (Hib) vaccine  You may need this if you have certain conditions. Human papillomavirus (HPV) vaccine  If recommended by your health care provider, you may need three doses over 6 months. You may receive vaccines as individual doses or as more than one vaccine together in one shot (combination vaccines). Talk with your health care provider about the risks and benefits of combination vaccines. What tests do I need? Blood tests  Lipid and cholesterol levels. These may be checked every 5 years, or more frequently if you are over 77 years old.  Hepatitis C test.  Hepatitis B test. Screening  Lung cancer screening. You may have this screening every year starting at age 29 if you have a 30-pack-year history of smoking and currently smoke or have quit within the past 15 years.  Colorectal cancer screening. All adults should have this screening starting at age 43 and continuing until age 63. Your health care provider may recommend  screening at age 44 if you are at increased risk. You will have tests every 1-10 years, depending on your results and the type of screening test.  Diabetes screening. This is done by checking your blood sugar (glucose) after you have not eaten for a while (fasting). You may have this done every 1-3 years.  Mammogram. This may be done every 1-2 years.  Talk with your health care provider about when you should start having regular mammograms. This may depend on whether you have a family history of breast cancer.  BRCA-related cancer screening. This may be done if you have a family history of breast, ovarian, tubal, or peritoneal cancers.  Pelvic exam and Pap test. This may be done every 3 years starting at age 30. Starting at age 56, this may be done every 5 years if you have a Pap test in combination with an HPV test. Other tests  Sexually transmitted disease (STD) testing.  Bone density scan. This is done to screen for osteoporosis. You may have this scan if you are at high risk for osteoporosis. Follow these instructions at home: Eating and drinking  Eat a diet that includes fresh fruits and vegetables, whole grains, lean protein, and low-fat dairy.  Take vitamin and mineral supplements as recommended by your health care provider.  Do not drink alcohol if: ? Your health care provider tells you not to drink. ? You are pregnant, may be pregnant, or are planning to become pregnant.  If you drink alcohol: ? Limit how much you have to 0-1 drink a day. ? Be aware of how much alcohol is in your drink. In the [place] one drink equals one 12 oz bottle of beer (355 mL), one 5 oz glass of wine (148 mL), or one 1 oz glass of hard liquor (44 mL). Lifestyle  Take daily care of your teeth and gums.  Stay active. Exercise for at least 30 minutes on 5 or more days each week.  Do not use any products that contain nicotine or tobacco, such as cigarettes, e-cigarettes, and chewing tobacco. If you need help quitting, ask your health care provider.  If you are sexually active, practice safe sex. Use a condom or other form of birth control (contraception) in order to prevent pregnancy and STIs (sexually transmitted infections).  If told by your health care provider, take low-dose aspirin daily starting at age 109. What's next?  Visit your health care  provider once a year for a well check visit.  Ask your health care provider how often you should have your eyes and teeth checked.  Stay up to date on all vaccines. This information is not intended to replace advice given to you by your health care provider. Make sure you discuss any questions you have with your health care provider. Document Revised: 07/13/2018 Document Reviewed: 07/13/2018 Elsevier Patient Education  2020 [employer].

## 2020-09-24 NOTE — Progress Notes (Signed)
Established Patient Office Visit     This visit occurred during the SARS-CoV-2 public health emergency.  Safety protocols were in place, including screening questions prior to the visit, additional usage of staff PPE, and extensive cleaning of exam room while observing appropriate contact time as indicated for disinfecting solutions.    CC/Reason for Visit: Annual preventive exam  HPI: Sharon Molina is a 49 y.o. female who is coming in today for the above mentioned reasons. Past Medical History is significant for: Hypertension that has been well controlled on amlodipine and chlorthalidone, anxiety well controlled on Lexapro, seasonal allergies on Zyrtec as well as vitamin D deficiency on 2000 international units of vitamin D daily.  She has no acute complaints today.  She has been doing well since I last saw her.  She is overdue for Covid booster, shingles and Tdap.  She had her flu vaccination at work.  She is also overdue for: Breast and cervical cancer screening.  She has changed her insurance and is waiting for the beginning of the year to have them scheduled.  She has eye and dental care.   Past Medical/Surgical History: Past Medical History:  Diagnosis Date  . Anxiety   . Hyperlipidemia   . Hypertension   . Morbid obesity ([place])     Past Surgical History:  Procedure Laterality Date  . CHOLECYSTECTOMY    . MASTOIDECTOMY    . tubes in ears      Social History:  reports that she has never smoked. She has never used smokeless tobacco. She reports that she does not drink alcohol and does not use drugs.  Allergies: Allergies  Allergen Reactions  . Demerol   . Lisinopril     Itching and swelling around eyes  . Sulfa Antibiotics     Family History:  Family History  Problem Relation Age of Onset  . Hypertension Father   . Parkinsonism Father   . Kidney cancer Father   . Hypertension Mother   . Cancer - Other Paternal Grandmother        brain  . Heart  disease Paternal Grandfather      Current Outpatient Medications:  .  amLODipine (NORVASC) 5 MG tablet, Take 1 tablet (5 mg total) by mouth daily., Disp: 90 tablet, Rfl: 3 .  cetirizine (ZYRTEC) 10 MG tablet, Take 10 mg by mouth daily., Disp: , Rfl:  .  chlorthalidone (HYGROTON) 25 MG tablet, Take by mouth., Disp: , Rfl:  .  Cholecalciferol (QC VITAMIN D3) 50 MCG (2000 UT) TABS, Take 1 tablet by mouth daily. , Disp: , Rfl:  .  escitalopram (LEXAPRO) 10 MG tablet, TAKE 1 TABLET (10 MG TOTAL) BY MOUTH DAILY., Disp: 90 tablet, Rfl: 1  Review of Systems:  Constitutional: Denies fever, chills, diaphoresis, appetite change and fatigue.  HEENT: Denies photophobia, eye pain, redness, hearing loss, ear pain, congestion, sore throat, rhinorrhea, sneezing, mouth sores, trouble swallowing, neck pain, neck stiffness and tinnitus.   Respiratory: Denies SOB, DOE, cough, chest tightness,  and wheezing.   Cardiovascular: Denies chest pain, palpitations and leg swelling.  Gastrointestinal: Denies nausea, vomiting, abdominal pain, diarrhea, constipation, blood in stool and abdominal distention.  Genitourinary: Denies dysuria, urgency, frequency, hematuria, flank pain and difficulty urinating.  Endocrine: Denies: hot or cold intolerance, sweats, changes in hair or nails, polyuria, polydipsia. Musculoskeletal: Denies myalgias, back pain, joint swelling, arthralgias and gait problem.  Skin: Denies pallor, rash and wound.  Neurological: Denies dizziness, seizures, syncope, weakness, light-headedness,  numbness and headaches.  Hematological: Denies adenopathy. Easy bruising, personal or family bleeding history  Psychiatric/Behavioral: Denies suicidal ideation, mood changes, confusion, nervousness, sleep disturbance and agitation    Physical Exam: Vitals:   09/24/20 1031  BP: 110/84  Pulse: (!) 102  Temp: 98.5 F (36.9 C)  TempSrc: Oral  SpO2: 97%  Weight: 190 lb 4.8 oz (86.3 kg)  Height: 5' (1.524 m)     Body mass index is 37.17 kg/m.   Constitutional: NAD, calm, comfortable Eyes: PERRL, lids and conjunctivae normal ENMT: Mucous membranes are moist. Posterior pharynx clear of any exudate or lesions. Normal dentition. Tympanic membrane is pearly white, no erythema or bulging. Neck: normal, supple, no masses, no thyromegaly Respiratory: clear to auscultation bilaterally, no wheezing, no crackles. Normal respiratory effort. No accessory muscle use.  Cardiovascular: Regular rate and rhythm, no murmurs / rubs / gallops. No extremity edema. Abdomen: no tenderness, no masses palpated. No hepatosplenomegaly. Bowel sounds positive.  Musculoskeletal: no clubbing / cyanosis. No joint deformity upper and lower extremities. Good ROM, no contractures. Normal muscle tone.  Skin: no rashes, lesions, ulcers. No induration Neurologic: CN 2-12 grossly intact. Sensation intact, DTR normal. Strength 5/5 in all 4.  Psychiatric: Normal judgment and insight. Alert and oriented x 3. Normal mood.    Impression and Plan:  Encounter for preventive health examination -She has routine eye and dental care. -Tdap today.  She is overdue for Covid booster and shingles. -No labs today as she is fasting, she would like her labs done in [place] and will notify us as to where we will send orders to. -She is overdue for colon, breast, cervical cancer screening, she would like to defer these till next year due to change in insurance.  Primary hypertension -Well-controlled on current doses of chlorthalidone and amlodipine.  Hyperlipidemia, unspecified hyperlipidemia type -Overdue for labs.  She is not fasting today.  She will notify us where she would like orders sent to.  Morbid obesity (HCC) -Discussed healthy lifestyle, including increased physical activity and better food choices to promote weight loss.  Anxiety -Well-controlled on escitalopram.  Need for tetanus booster -Tdap administered  today.   Patient Instructions  -Nice seeing you today!!  -TDap today.  -COVID Booster and shingles vaccines are pending.  -Due for PAP, mammogram and colonoscopy. Let us know when we can help you schedule.  -Lab work pending. Let me know where to fax orders to.  -Schedule follow up in 6 months.   Preventive Care 68-73 Years Old, Female Preventive care refers to visits with your health care provider and lifestyle choices that can promote health and wellness. This includes:  A yearly physical exam. This may also be called an annual well check.  Regular dental visits and eye exams.  Immunizations.  Screening for certain conditions.  Healthy lifestyle choices, such as eating a healthy diet, getting regular exercise, not using drugs or products that contain nicotine and tobacco, and limiting alcohol use. What can I expect for my preventive care visit? Physical exam Your health care provider will check your:  Height and weight. This may be used to calculate body mass index (BMI), which tells if you are at a healthy weight.  Heart rate and blood pressure.  Skin for abnormal spots. Counseling Your health care provider may ask you questions about your:  Alcohol, tobacco, and drug use.  Emotional well-being.  Home and relationship well-being.  Sexual activity.  Eating habits.  Work and work Astron[employer].  Method of birth  control.  Menstrual cycle.  Pregnancy history. What immunizations do I need?  Influenza (flu) vaccine  This is recommended every year. Tetanus, diphtheria, and pertussis (Tdap) vaccine  You may need a Td booster every 10 years. Varicella (chickenpox) vaccine  You may need this if you have not been vaccinated. Zoster (shingles) vaccine  You may need this after age 68. Measles, mumps, and rubella (MMR) vaccine  You may need at least one dose of MMR if you were born in 1957 or later. You may also need a second dose. Pneumococcal conjugate  (PCV13) vaccine  You may need this if you have certain conditions and were not previously vaccinated. Pneumococcal polysaccharide (PPSV23) vaccine  You may need one or two doses if you smoke cigarettes or if you have certain conditions. Meningococcal conjugate (MenACWY) vaccine  You may need this if you have certain conditions. Hepatitis A vaccine  You may need this if you have certain conditions or if you travel or work in places where you may be exposed to hepatitis A. Hepatitis B vaccine  You may need this if you have certain conditions or if you travel or work in places where you may be exposed to hepatitis B. Haemophilus influenzae type b (Hib) vaccine  You may need this if you have certain conditions. Human papillomavirus (HPV) vaccine  If recommended by your health care provider, you may need three doses over 6 months. You may receive vaccines as individual doses or as more than one vaccine together in one shot (combination vaccines). Talk with your health care provider about the risks and benefits of combination vaccines. What tests do I need? Blood tests  Lipid and cholesterol levels. These may be checked every 5 years, or more frequently if you are over 52 years old.  Hepatitis C test.  Hepatitis B test. Screening  Lung cancer screening. You may have this screening every year starting at age 11 if you have a 30-pack-year history of smoking and currently smoke or have quit within the past 15 years.  Colorectal cancer screening. All adults should have this screening starting at age 61 and continuing until age 86. Your health care provider may recommend screening at age 63 if you are at increased risk. You will have tests every 1-10 years, depending on your results and the type of screening test.  Diabetes screening. This is done by checking your blood sugar (glucose) after you have not eaten for a while (fasting). You may have this done every 1-3 years.  Mammogram. This  may be done every 1-2 years. Talk with your health care provider about when you should start having regular mammograms. This may depend on whether you have a family history of breast cancer.  BRCA-related cancer screening. This may be done if you have a family history of breast, ovarian, tubal, or peritoneal cancers.  Pelvic exam and Pap test. This may be done every 3 years starting at age 68. Starting at age 49, this may be done every 5 years if you have a Pap test in combination with an HPV test. Other tests  Sexually transmitted disease (STD) testing.  Bone density scan. This is done to screen for osteoporosis. You may have this scan if you are at high risk for osteoporosis. Follow these instructions at home: Eating and drinking  Eat a diet that includes fresh fruits and vegetables, whole grains, lean protein, and low-fat dairy.  Take vitamin and mineral supplements as recommended by your health care provider.  Do  not drink alcohol if: ? Your health care provider tells you not to drink. ? You are pregnant, may be pregnant, or are planning to become pregnant.  If you drink alcohol: ? Limit how much you have to 0-1 drink a day. ? Be aware of how much alcohol is in your drink. In the [place] one drink equals one 12 oz bottle of beer (355 mL), one 5 oz glass of wine (148 mL), or one 1 oz glass of hard liquor (44 mL). Lifestyle  Take daily care of your teeth and gums.  Stay active. Exercise for at least 30 minutes on 5 or more days each week.  Do not use any products that contain nicotine or tobacco, such as cigarettes, e-cigarettes, and chewing tobacco. If you need help quitting, ask your health care provider.  If you are sexually active, practice safe sex. Use a condom or other form of birth control (contraception) in order to prevent pregnancy and STIs (sexually transmitted infections).  If told by your health care provider, take low-dose aspirin daily starting at age 50. What's  next?  Visit your health care provider once a year for a well check visit.  Ask your health care provider how often you should have your eyes and teeth checked.  Stay up to date on all vaccines. This information is not intended to replace advice given to you by your health care provider. Make sure you discuss any questions you have with your health care provider. Document Revised: 07/13/2018 Document Reviewed: 07/13/2018 Elsevier Patient Education  2020 [place], [place] [facility]

## 2020-11-05 ENCOUNTER — Other Ambulatory Visit: Payer: Self-pay | Admitting: Internal Medicine

## 2020-11-11 ENCOUNTER — Other Ambulatory Visit: Payer: Self-pay | Admitting: Internal Medicine

## 2020-11-11 MED FILL — AMLODIPINE BESYLATE 5 MG TA: 5 | 90 days supply | Qty: 90 | Fill #0

## 2020-12-03 MED FILL — ESCITALOPRAM 10 MG TABLET: 10 | 30 days supply | Qty: 30 | Fill #1

## 2021-01-07 MED FILL — ESCITALOPRAM 10 MG TABLET: 10 | 30 days supply | Qty: 30 | Fill #2

## 2021-02-17 ENCOUNTER — Other Ambulatory Visit (HOSPITAL_COMMUNITY): Payer: Self-pay

## 2021-02-17 MED FILL — Amlodipine Besylate Tab 5 MG (Base Equivalent): ORAL | 30 days supply | Qty: 30 | Fill #0 | Status: AC

## 2021-02-17 MED FILL — Escitalopram Oxalate Tab 10 MG (Base Equiv): ORAL | 30 days supply | Qty: 30 | Fill #0 | Status: AC

## 2021-03-18 ENCOUNTER — Other Ambulatory Visit: Payer: Self-pay | Admitting: Internal Medicine

## 2021-03-18 ENCOUNTER — Other Ambulatory Visit (HOSPITAL_COMMUNITY): Payer: Self-pay

## 2021-03-18 MED ORDER — ESCITALOPRAM OXALATE 10 MG PO TABS
ORAL_TABLET | Freq: Every day | ORAL | 1 refills | Status: DC
  Filled 2021-03-18: qty 30, 30d supply, fill #0
  Filled 2021-04-20: qty 30, 30d supply, fill #1
  Filled 2021-05-20: qty 30, 30d supply, fill #2
  Filled 2021-06-22: qty 30, 30d supply, fill #3
  Filled 2021-07-29: qty 30, 30d supply, fill #4
  Filled 2021-09-07: qty 30, 30d supply, fill #0

## 2021-03-18 MED ORDER — CHLORTHALIDONE 50 MG PO TABS
ORAL_TABLET | ORAL | 1 refills | Status: DC
  Filled 2021-03-18: qty 30, 30d supply, fill #0
  Filled 2021-04-30: qty 30, 30d supply, fill #1
  Filled 2021-07-08: qty 30, 30d supply, fill #2
  Filled 2021-09-07: qty 30, 30d supply, fill #0
  Filled 2021-12-03: qty 30, 30d supply, fill #1
  Filled 2022-02-02: qty 30, 30d supply, fill #2

## 2021-03-19 ENCOUNTER — Other Ambulatory Visit (HOSPITAL_COMMUNITY): Payer: Self-pay

## 2021-03-19 MED FILL — Amlodipine Besylate Tab 5 MG (Base Equivalent): ORAL | 30 days supply | Qty: 30 | Fill #1 | Status: AC

## 2021-03-25 ENCOUNTER — Ambulatory Visit: Payer: Managed Care, Other (non HMO) | Admitting: Internal Medicine

## 2021-03-31 ENCOUNTER — Other Ambulatory Visit: Payer: Self-pay

## 2021-04-01 ENCOUNTER — Encounter: Payer: Self-pay | Admitting: Internal Medicine

## 2021-04-01 ENCOUNTER — Other Ambulatory Visit: Payer: Self-pay | Admitting: Internal Medicine

## 2021-04-01 ENCOUNTER — Other Ambulatory Visit (HOSPITAL_COMMUNITY): Payer: Self-pay

## 2021-04-01 ENCOUNTER — Ambulatory Visit: Payer: 59 | Admitting: Internal Medicine

## 2021-04-01 VITALS — BP 120/80 | HR 80 | Temp 98.2°F | Wt 195.8 lb

## 2021-04-01 DIAGNOSIS — E785 Hyperlipidemia, unspecified: Secondary | ICD-10-CM

## 2021-04-01 DIAGNOSIS — F419 Anxiety disorder, unspecified: Secondary | ICD-10-CM | POA: Diagnosis not present

## 2021-04-01 DIAGNOSIS — I10 Essential (primary) hypertension: Secondary | ICD-10-CM | POA: Diagnosis not present

## 2021-04-01 DIAGNOSIS — E538 Deficiency of other specified B group vitamins: Secondary | ICD-10-CM | POA: Insufficient documentation

## 2021-04-01 LAB — VITAMIN D 25 HYDROXY (VIT D DEFICIENCY, FRACTURES): VITD: 32.19 ng/mL (ref 30.00–100.00)

## 2021-04-01 LAB — COMPREHENSIVE METABOLIC PANEL
ALT: 17 U/L (ref 0–35)
AST: 13 U/L (ref 0–37)
Albumin: 4.2 g/dL (ref 3.5–5.2)
Alkaline Phosphatase: 58 U/L (ref 39–117)
BUN: 12 mg/dL (ref 6–23)
CO2: 30 mEq/L (ref 19–32)
Calcium: 9.3 mg/dL (ref 8.4–10.5)
Chloride: 95 mEq/L — ABNORMAL LOW (ref 96–112)
Creatinine, Ser: 0.69 mg/dL (ref 0.40–1.20)
GFR: 100.74 mL/min (ref >60.00)
Glucose, Bld: 87 mg/dL (ref 70–99)
Potassium: 3.9 mEq/L (ref 3.5–5.1)
Sodium: 136 mEq/L (ref 135–145)
Total Bilirubin: 0.3 mg/dL (ref 0.2–1.2)
Total Protein: 7.9 g/dL (ref 6.0–8.3)

## 2021-04-01 LAB — HEMOGLOBIN A1C: Hgb A1c MFr Bld: 5.9 % (ref 4.6–6.5)

## 2021-04-01 LAB — LIPID PANEL
Cholesterol: 241 mg/dL — ABNORMAL HIGH (ref 0–200)
HDL: 55 mg/dL (ref >39.00)
LDL Cholesterol: 149 mg/dL — ABNORMAL HIGH (ref 0–99)
NonHDL: 186.33
Total CHOL/HDL Ratio: 4
Triglycerides: 185 mg/dL — ABNORMAL HIGH (ref 0.0–149.0)
VLDL: 37 mg/dL (ref 0.0–40.0)

## 2021-04-01 LAB — CBC WITH DIFFERENTIAL/PLATELET
Basophils Absolute: 0 10*3/uL (ref 0.0–0.1)
Basophils Relative: 0.5 % (ref 0.0–3.0)
Eosinophils Absolute: 0.2 10*3/uL (ref 0.0–0.7)
Eosinophils Relative: 2.7 % (ref 0.0–5.0)
HCT: 37.3 % (ref 36.0–46.0)
Hemoglobin: 12.7 g/dL (ref 12.0–15.0)
Lymphocytes Relative: 19 % (ref 12.0–46.0)
Lymphs Abs: 1.7 10*3/uL (ref 0.7–4.0)
MCHC: 34 g/dL (ref 30.0–36.0)
MCV: 84.2 fl (ref 78.0–100.0)
Monocytes Absolute: 0.5 10*3/uL (ref 0.1–1.0)
Monocytes Relative: 5.3 % (ref 3.0–12.0)
Neutro Abs: 6.5 10*3/uL (ref 1.4–7.7)
Neutrophils Relative %: 72.5 % (ref 43.0–77.0)
Platelets: 330 10*3/uL (ref 150.0–400.0)
RBC: 4.44 Mil/uL (ref 3.87–5.11)
RDW: 14.6 % (ref 11.5–15.5)
WBC: 8.9 10*3/uL (ref 4.0–10.5)

## 2021-04-01 LAB — TSH: TSH: 3.5 u[IU]/mL (ref 0.35–4.50)

## 2021-04-01 LAB — VITAMIN B12: Vitamin B-12: 204 pg/mL — ABNORMAL LOW (ref 211–911)

## 2021-04-01 MED ORDER — ATORVASTATIN CALCIUM 10 MG PO TABS
10.0000 mg | ORAL_TABLET | Freq: Every day | ORAL | 1 refills | Status: DC
Start: 2021-04-01 — End: 2021-10-27
  Filled 2021-04-01: qty 30, 30d supply, fill #0
  Filled 2021-04-30: qty 30, 30d supply, fill #1
  Filled 2021-06-05: qty 30, 30d supply, fill #2
  Filled 2021-07-08: qty 30, 30d supply, fill #3
  Filled 2021-08-18: qty 30, 30d supply, fill #4
  Filled 2021-09-07 – 2021-09-21 (×2): qty 30, 30d supply, fill #0

## 2021-04-01 NOTE — Progress Notes (Signed)
Established Patient Office Visit     This visit occurred during the SARS-CoV-2 public health emergency.  Safety protocols were in place, including screening questions prior to the visit, additional usage of staff PPE, and extensive cleaning of exam room while observing appropriate contact time as indicated for disinfecting solutions.    CC/Reason for Visit: 43-month follow-up chronic medical conditions  HPI: Sharon Molina is a 50 y.o. female who is coming in today for the above mentioned reasons. Past Medical History is significant for:  Hypertension that has been well controlled on amlodipine and chlorthalidone, anxiety well controlled on Lexapro, seasonal allergies on Zyrtec as well as vitamin D deficiency.  She has no acute complaints today.  Has been doing well since we last spoke.  She never had her physical labs in November, is fasting today and is requesting blood work today.  She is overdue for her colonoscopy, she declines referral today.   Past Medical/Surgical History: Past Medical History:  Diagnosis Date  . Anxiety   . Hyperlipidemia   . Hypertension   . Morbid obesity (HCC)     Past Surgical History:  Procedure Laterality Date  . CHOLECYSTECTOMY    . MASTOIDECTOMY    . tubes in ears      Social History:  reports that she has never smoked. She has never used smokeless tobacco. She reports that she does not drink alcohol and does not use drugs.  Allergies: Allergies  Allergen Reactions  . Demerol   . Lisinopril     Itching and swelling around eyes  . Sulfa Antibiotics     Family History:  Family History  Problem Relation Age of Onset  . Hypertension Father   . Parkinsonism Father   . Kidney cancer Father   . Hypertension Mother   . Cancer - Other Paternal Grandmother        brain  . Heart disease Paternal Grandfather      Current Outpatient Medications:  .  amLODipine (NORVASC) 5 MG tablet, TAKE 1 TABLET BY MOUTH ONCE DAILY., Disp:  90 tablet, Rfl: 3 .  cetirizine (ZYRTEC) 10 MG tablet, Take 10 mg by mouth daily., Disp: , Rfl:  .  chlorthalidone (HYGROTON) 25 MG tablet, Take by mouth., Disp: , Rfl:  .  chlorthalidone (HYGROTON) 50 MG tablet, TAKE 1/2 TABLET BY MOUTH DAILY. MAY TAKE 1 TABLET IF SWELLING., Disp: 90 tablet, Rfl: 1 .  Cholecalciferol (QC VITAMIN D3) 50 MCG (2000 UT) TABS, Take 1 tablet by mouth daily. , Disp: , Rfl:  .  escitalopram (LEXAPRO) 10 MG tablet, TAKE 1 TABLET BY MOUTH ONCE DAILY., Disp: 90 tablet, Rfl: 1  Review of Systems:  Constitutional: Denies fever, chills, diaphoresis, appetite change and fatigue.  HEENT: Denies photophobia, eye pain, redness, hearing loss, ear pain, congestion, sore throat, rhinorrhea, sneezing, mouth sores, trouble swallowing, neck pain, neck stiffness and tinnitus.   Respiratory: Denies SOB, DOE, cough, chest tightness,  and wheezing.   Cardiovascular: Denies chest pain, palpitations and leg swelling.  Gastrointestinal: Denies nausea, vomiting, abdominal pain, diarrhea, constipation, blood in stool and abdominal distention.  Genitourinary: Denies dysuria, urgency, frequency, hematuria, flank pain and difficulty urinating.  Endocrine: Denies: hot or cold intolerance, sweats, changes in hair or nails, polyuria, polydipsia. Musculoskeletal: Denies myalgias, back pain, joint swelling, arthralgias and gait problem.  Skin: Denies pallor, rash and wound.  Neurological: Denies dizziness, seizures, syncope, weakness, light-headedness, numbness and headaches.  Hematological: Denies adenopathy. Easy bruising, personal or family bleeding  history  Psychiatric/Behavioral: Denies suicidal ideation, mood changes, confusion, nervousness, sleep disturbance and agitation    Physical Exam: Vitals:   04/01/21 0730  BP: 120/80  Pulse: 80  Temp: 98.2 F (36.8 C)  TempSrc: Oral  SpO2: 99%  Weight: 195 lb 12.8 oz (88.8 kg)    Body mass index is 38.24 kg/m.   Constitutional: NAD,  calm, comfortable Eyes: PERRL, lids and conjunctivae normal ENMT: Mucous membranes are moist.  Respiratory: clear to auscultation bilaterally, no wheezing, no crackles. Normal respiratory effort. No accessory muscle use.  Cardiovascular: Regular rate and rhythm, no murmurs / rubs / gallops. No extremity edema.  Neurologic: Grossly intact and nonfocal Psychiatric: Normal judgment and insight. Alert and oriented x 3. Normal mood.    Impression and Plan:  Primary hypertension  - Plan: CBC with Differential/Platelet, Comprehensive metabolic panel, TSH, Vitamin B12, VITAMIN D 25 Hydroxy (Vit-D Deficiency, Fractures) -Blood pressure is well controlled today on amlodipine and chlorthalidone.  Hyperlipidemia, unspecified hyperlipidemia type  - Plan: Lipid panel -She is not on medication.  Morbid obesity (HCC)  -Discussed healthy lifestyle, including increased physical activity and better food choices to promote weight loss.  Anxiety -Well-controlled on Lexapro.  Time spent: 31 minutes reviewing chart, interviewing and examining patient and formulating plan of care.   Patient Instructions  -Nice seeing you today!!  -Lab work today; will notify you once results are available.  -Schedule follow up for your physical in 6 months.       Chaya Jan, MD [facility]

## 2021-04-01 NOTE — Patient Instructions (Signed)
-  Nice seeing you today!!  -Lab work today; will notify you once results are available.  -Schedule follow up for your physical in 6 months. 

## 2021-04-02 ENCOUNTER — Other Ambulatory Visit: Payer: Self-pay | Admitting: Internal Medicine

## 2021-04-02 DIAGNOSIS — E785 Hyperlipidemia, unspecified: Secondary | ICD-10-CM

## 2021-04-20 ENCOUNTER — Other Ambulatory Visit (HOSPITAL_COMMUNITY): Payer: Self-pay

## 2021-04-20 MED FILL — Amlodipine Besylate Tab 5 MG (Base Equivalent): ORAL | 30 days supply | Qty: 30 | Fill #2 | Status: AC

## 2021-04-30 ENCOUNTER — Other Ambulatory Visit (HOSPITAL_COMMUNITY): Payer: Self-pay

## 2021-05-20 ENCOUNTER — Other Ambulatory Visit (HOSPITAL_COMMUNITY): Payer: Self-pay

## 2021-05-20 MED FILL — Amlodipine Besylate Tab 5 MG (Base Equivalent): ORAL | 30 days supply | Qty: 30 | Fill #3 | Status: AC

## 2021-06-05 ENCOUNTER — Other Ambulatory Visit (HOSPITAL_COMMUNITY): Payer: Self-pay

## 2021-06-22 ENCOUNTER — Other Ambulatory Visit (HOSPITAL_COMMUNITY): Payer: Self-pay

## 2021-06-22 MED FILL — Amlodipine Besylate Tab 5 MG (Base Equivalent): ORAL | 30 days supply | Qty: 30 | Fill #4 | Status: AC

## 2021-07-08 ENCOUNTER — Other Ambulatory Visit: Payer: Self-pay

## 2021-07-08 ENCOUNTER — Other Ambulatory Visit (INDEPENDENT_AMBULATORY_CARE_PROVIDER_SITE_OTHER): Payer: 59

## 2021-07-08 ENCOUNTER — Other Ambulatory Visit (HOSPITAL_COMMUNITY): Payer: Self-pay

## 2021-07-08 DIAGNOSIS — E785 Hyperlipidemia, unspecified: Secondary | ICD-10-CM | POA: Diagnosis not present

## 2021-07-08 LAB — LIPID PANEL
Cholesterol: 177 mg/dL (ref 0–200)
HDL: 45.4 mg/dL (ref >39.00)
LDL Cholesterol: 105 mg/dL — ABNORMAL HIGH (ref 0–99)
NonHDL: 131.57
Total CHOL/HDL Ratio: 4
Triglycerides: 132 mg/dL (ref 0.0–149.0)
VLDL: 26.4 mg/dL (ref 0.0–40.0)

## 2021-07-29 ENCOUNTER — Other Ambulatory Visit (HOSPITAL_COMMUNITY): Payer: Self-pay

## 2021-07-29 MED FILL — Amlodipine Besylate Tab 5 MG (Base Equivalent): ORAL | 30 days supply | Qty: 30 | Fill #5 | Status: AC

## 2021-08-18 ENCOUNTER — Other Ambulatory Visit (HOSPITAL_COMMUNITY): Payer: Self-pay

## 2021-09-07 ENCOUNTER — Other Ambulatory Visit (HOSPITAL_COMMUNITY): Payer: Self-pay

## 2021-09-07 MED FILL — Amlodipine Besylate Tab 5 MG (Base Equivalent): ORAL | 30 days supply | Qty: 30 | Fill #0 | Status: AC

## 2021-09-21 ENCOUNTER — Other Ambulatory Visit (HOSPITAL_COMMUNITY): Payer: Self-pay

## 2021-10-02 ENCOUNTER — Encounter: Payer: 59 | Admitting: Internal Medicine

## 2021-10-07 ENCOUNTER — Other Ambulatory Visit: Payer: Self-pay | Admitting: Internal Medicine

## 2021-10-07 ENCOUNTER — Other Ambulatory Visit (HOSPITAL_COMMUNITY): Payer: Self-pay

## 2021-10-07 MED ORDER — ESCITALOPRAM OXALATE 10 MG PO TABS
ORAL_TABLET | Freq: Every day | ORAL | 1 refills | Status: DC
  Filled 2021-10-07: qty 30, 30d supply, fill #0
  Filled 2021-11-13: qty 30, 30d supply, fill #1
  Filled 2021-12-21: qty 30, 30d supply, fill #2
  Filled 2022-01-25: qty 30, 30d supply, fill #3
  Filled 2022-02-17: qty 30, 30d supply, fill #4
  Filled 2022-03-23: qty 30, 30d supply, fill #5

## 2021-10-07 MED FILL — Amlodipine Besylate Tab 5 MG (Base Equivalent): ORAL | 30 days supply | Qty: 30 | Fill #1 | Status: AC

## 2021-10-27 ENCOUNTER — Other Ambulatory Visit (HOSPITAL_COMMUNITY): Payer: Self-pay

## 2021-10-27 ENCOUNTER — Other Ambulatory Visit: Payer: Self-pay | Admitting: Internal Medicine

## 2021-10-27 DIAGNOSIS — E785 Hyperlipidemia, unspecified: Secondary | ICD-10-CM

## 2021-10-27 MED ORDER — ATORVASTATIN CALCIUM 10 MG PO TABS
10.0000 mg | ORAL_TABLET | Freq: Every day | ORAL | 1 refills | Status: DC
  Filled 2021-10-27: qty 30, 30d supply, fill #0
  Filled 2021-12-03: qty 30, 30d supply, fill #1
  Filled 2022-01-04: qty 30, 30d supply, fill #2
  Filled 2022-02-02: qty 30, 30d supply, fill #3
  Filled 2022-02-26: qty 30, 30d supply, fill #4
  Filled 2022-04-02: qty 30, 30d supply, fill #5

## 2021-11-13 ENCOUNTER — Other Ambulatory Visit (HOSPITAL_COMMUNITY): Payer: Self-pay

## 2021-11-13 ENCOUNTER — Other Ambulatory Visit: Payer: Self-pay | Admitting: Internal Medicine

## 2021-11-16 ENCOUNTER — Other Ambulatory Visit (HOSPITAL_COMMUNITY): Payer: Self-pay

## 2021-11-16 ENCOUNTER — Other Ambulatory Visit: Payer: Self-pay | Admitting: Internal Medicine

## 2021-11-18 ENCOUNTER — Other Ambulatory Visit (HOSPITAL_COMMUNITY): Payer: Self-pay

## 2021-11-18 MED ORDER — AMLODIPINE BESYLATE 5 MG PO TABS
ORAL_TABLET | Freq: Every day | ORAL | 0 refills | Status: DC
  Filled 2021-11-18: qty 30, 30d supply, fill #0

## 2021-11-24 ENCOUNTER — Encounter: Payer: 59 | Admitting: Internal Medicine

## 2021-12-03 ENCOUNTER — Other Ambulatory Visit (HOSPITAL_COMMUNITY): Payer: Self-pay

## 2021-12-21 ENCOUNTER — Other Ambulatory Visit (HOSPITAL_COMMUNITY): Payer: Self-pay

## 2021-12-21 ENCOUNTER — Other Ambulatory Visit: Payer: Self-pay | Admitting: Internal Medicine

## 2021-12-21 MED ORDER — AMLODIPINE BESYLATE 5 MG PO TABS
ORAL_TABLET | Freq: Every day | ORAL | 0 refills | Status: DC
  Filled 2021-12-21: qty 30, 30d supply, fill #0

## 2022-01-04 ENCOUNTER — Other Ambulatory Visit (HOSPITAL_COMMUNITY): Payer: Self-pay

## 2022-01-07 ENCOUNTER — Ambulatory Visit (INDEPENDENT_AMBULATORY_CARE_PROVIDER_SITE_OTHER): Payer: 59 | Admitting: Internal Medicine

## 2022-01-07 ENCOUNTER — Encounter: Payer: Self-pay | Admitting: Internal Medicine

## 2022-01-07 VITALS — BP 110/80 | HR 100 | Temp 97.5°F | Ht 60.0 in | Wt 202.6 lb

## 2022-01-07 DIAGNOSIS — Z1211 Encounter for screening for malignant neoplasm of colon: Secondary | ICD-10-CM | POA: Diagnosis not present

## 2022-01-07 DIAGNOSIS — F419 Anxiety disorder, unspecified: Secondary | ICD-10-CM

## 2022-01-07 DIAGNOSIS — Z0000 Encounter for general adult medical examination without abnormal findings: Secondary | ICD-10-CM | POA: Diagnosis not present

## 2022-01-07 DIAGNOSIS — E785 Hyperlipidemia, unspecified: Secondary | ICD-10-CM | POA: Diagnosis not present

## 2022-01-07 DIAGNOSIS — Z1231 Encounter for screening mammogram for malignant neoplasm of breast: Secondary | ICD-10-CM | POA: Diagnosis not present

## 2022-01-07 DIAGNOSIS — I10 Essential (primary) hypertension: Secondary | ICD-10-CM

## 2022-01-07 DIAGNOSIS — Z23 Encounter for immunization: Secondary | ICD-10-CM

## 2022-01-07 LAB — CBC WITH DIFFERENTIAL/PLATELET
Basophils Absolute: 0.1 10*3/uL (ref 0.0–0.1)
Basophils Relative: 0.7 % (ref 0.0–3.0)
Eosinophils Absolute: 0.2 10*3/uL (ref 0.0–0.7)
Eosinophils Relative: 3 % (ref 0.0–5.0)
HCT: 37.2 % (ref 36.0–46.0)
Hemoglobin: 12.5 g/dL (ref 12.0–15.0)
Lymphocytes Relative: 18.1 % (ref 12.0–46.0)
Lymphs Abs: 1.5 10*3/uL (ref 0.7–4.0)
MCHC: 33.7 g/dL (ref 30.0–36.0)
MCV: 84.2 fl (ref 78.0–100.0)
Monocytes Absolute: 0.5 10*3/uL (ref 0.1–1.0)
Monocytes Relative: 6.4 % (ref 3.0–12.0)
Neutro Abs: 5.9 10*3/uL (ref 1.4–7.7)
Neutrophils Relative %: 71.8 % (ref 43.0–77.0)
Platelets: 310 10*3/uL (ref 150.0–400.0)
RBC: 4.42 Mil/uL (ref 3.87–5.11)
RDW: 15.7 % — ABNORMAL HIGH (ref 11.5–15.5)
WBC: 8.2 10*3/uL (ref 4.0–10.5)

## 2022-01-07 LAB — COMPREHENSIVE METABOLIC PANEL
ALT: 16 U/L (ref 0–35)
AST: 16 U/L (ref 0–37)
Albumin: 4.1 g/dL (ref 3.5–5.2)
Alkaline Phosphatase: 61 U/L (ref 39–117)
BUN: 15 mg/dL (ref 6–23)
CO2: 32 mEq/L (ref 19–32)
Calcium: 9.8 mg/dL (ref 8.4–10.5)
Chloride: 96 mEq/L (ref 96–112)
Creatinine, Ser: 0.75 mg/dL (ref 0.40–1.20)
GFR: 91.91 mL/min (ref >60.00)
Glucose, Bld: 84 mg/dL (ref 70–99)
Potassium: 3.4 mEq/L — ABNORMAL LOW (ref 3.5–5.1)
Sodium: 136 mEq/L (ref 135–145)
Total Bilirubin: 0.4 mg/dL (ref 0.2–1.2)
Total Protein: 8.2 g/dL (ref 6.0–8.3)

## 2022-01-07 LAB — LIPID PANEL
Cholesterol: 193 mg/dL (ref 0–200)
HDL: 53.8 mg/dL (ref >39.00)
LDL Cholesterol: 110 mg/dL — ABNORMAL HIGH (ref 0–99)
NonHDL: 139.13
Total CHOL/HDL Ratio: 4
Triglycerides: 144 mg/dL (ref 0.0–149.0)
VLDL: 28.8 mg/dL (ref 0.0–40.0)

## 2022-01-07 LAB — VITAMIN B12: Vitamin B-12: 227 pg/mL (ref 211–911)

## 2022-01-07 LAB — TSH: TSH: 6.59 u[IU]/mL — ABNORMAL HIGH (ref 0.35–5.50)

## 2022-01-07 LAB — HEMOGLOBIN A1C: Hgb A1c MFr Bld: 6 % (ref 4.6–6.5)

## 2022-01-07 LAB — VITAMIN D 25 HYDROXY (VIT D DEFICIENCY, FRACTURES): VITD: 41.89 ng/mL (ref 30.00–100.00)

## 2022-01-07 NOTE — Patient Instructions (Signed)
-  Nice seeing you today!!  -Lab work today; will notify you once results are available.  -Shingles vaccine today.  -Remember your COVID booster.  -Mammogram and colonoscopy will be scheduled. Remember to call GYN.  -Schedule follow up in 6 months.

## 2022-01-07 NOTE — Addendum Note (Signed)
Addended by: Kern Reap B on: 01/07/2022 08:00 AM   Modules accepted: Orders

## 2022-01-07 NOTE — Addendum Note (Signed)
Addended by: Erline Hau on: 01/07/2022 07:42 AM   Modules accepted: Orders

## 2022-01-07 NOTE — Progress Notes (Signed)
Established Patient Office Visit     This visit occurred during the SARS-CoV-2 public health emergency.  Safety protocols were in place, including screening questions prior to the visit, additional usage of staff PPE, and extensive cleaning of exam room while observing appropriate contact time as indicated for disinfecting solutions.    CC/Reason for Visit: Annual preventive exam  HPI: Sharon Molina is a 51 y.o. female who is coming in today for the above mentioned reasons. Past Medical History is significant for: Hypertension, hyperlipidemia, anxiety disorder on Lexapro, seasonal allergies.  She has no acute concerns or complaints.  She has routine eye care, she has not had a dental exam in a few years.  She is overdue for a COVID booster and for shingles vaccine.  She is overdue for all age-appropriate cancer screening including colonoscopy, mammogram, Pap smear.   Past Medical/Surgical History: Past Medical History:  Diagnosis Date   Anxiety    Hyperlipidemia    Hypertension    Morbid obesity ([place])     Past Surgical History:  Procedure Laterality Date   CHOLECYSTECTOMY     MASTOIDECTOMY     tubes in ears      Social History:  reports that she has never smoked. She has never used smokeless tobacco. She reports that she does not drink alcohol and does not use drugs.  Allergies: Allergies  Allergen Reactions   Demerol    Lisinopril     Itching and swelling around eyes   Sulfa Antibiotics     Family History:  Family History  Problem Relation Age of Onset   Hypertension Father    Parkinsonism Father    Kidney cancer Father    Hypertension Mother    Cancer - Other Paternal Grandmother        brain   Heart disease Paternal Grandfather      Current Outpatient Medications:    amLODipine (NORVASC) 5 MG tablet, TAKE 1 TABLET BY MOUTH ONCE DAILY., Disp: 30 tablet, Rfl: 0   atorvastatin (LIPITOR) 10 MG tablet, Take 1 tablet by mouth daily., Disp: 90  tablet, Rfl: 1   cetirizine (ZYRTEC) 10 MG tablet, Take 10 mg by mouth daily., Disp: , Rfl:    chlorthalidone (HYGROTON) 25 MG tablet, Take by mouth., Disp: , Rfl:    chlorthalidone (HYGROTON) 50 MG tablet, TAKE 1/2 TABLET BY MOUTH DAILY. MAY TAKE 1 TABLET IF SWELLING., Disp: 90 tablet, Rfl: 1   Cholecalciferol (QC VITAMIN D3) 50 MCG (2000 UT) TABS, Take 1 tablet by mouth daily. , Disp: , Rfl:    escitalopram (LEXAPRO) 10 MG tablet, TAKE 1 TABLET BY MOUTH ONCE DAILY., Disp: 90 tablet, Rfl: 1  Review of Systems:  Constitutional: Denies fever, chills, diaphoresis, appetite change and fatigue.  HEENT: Denies photophobia, eye pain, redness, hearing loss, ear pain, congestion, sore throat, rhinorrhea, sneezing, mouth sores, trouble swallowing, neck pain, neck stiffness and tinnitus.   Respiratory: Denies SOB, DOE, cough, chest tightness,  and wheezing.   Cardiovascular: Denies chest pain, palpitations and leg swelling.  Gastrointestinal: Denies nausea, vomiting, abdominal pain, diarrhea, constipation, blood in stool and abdominal distention.  Genitourinary: Denies dysuria, urgency, frequency, hematuria, flank pain and difficulty urinating.  Endocrine: Denies: hot or cold intolerance, sweats, changes in hair or nails, polyuria, polydipsia. Musculoskeletal: Denies myalgias, back pain, joint swelling, arthralgias and gait problem.  Skin: Denies pallor, rash and wound.  Neurological: Denies dizziness, seizures, syncope, weakness, light-headedness, numbness and headaches.  Hematological: Denies adenopathy. Easy bruising,  personal or family bleeding history  Psychiatric/Behavioral: Denies suicidal ideation, mood changes, confusion, nervousness, sleep disturbance and agitation    Physical Exam: Vitals:   01/07/22 0703  BP: 110/80  Pulse: 100  Temp: (!) 97.5 F (36.4 C)  TempSrc: Oral  SpO2: 98%  Weight: 202 lb 9.6 oz (91.9 kg)  Height: 5' (1.524 m)    Body mass index is 39.57  kg/m.   Constitutional: NAD, calm, comfortable Eyes: PERRL, lids and conjunctivae normal ENMT: Mucous membranes are moist. Posterior pharynx clear of any exudate or lesions. Normal dentition. Tympanic membrane is pearly white, no erythema or bulging. Neck: normal, supple, no masses, no thyromegaly Respiratory: clear to auscultation bilaterally, no wheezing, no crackles. Normal respiratory effort. No accessory muscle use.  Cardiovascular: Regular rate and rhythm, no murmurs / rubs / gallops. No extremity edema. 2+ pedal pulses. No carotid bruits.  Abdomen: no tenderness, no masses palpated. No hepatosplenomegaly. Bowel sounds positive.  Musculoskeletal: no clubbing / cyanosis. No joint deformity upper and lower extremities. Good ROM, no contractures. Normal muscle tone.  Skin: no rashes, lesions, ulcers. No induration Neurologic: CN 2-12 grossly intact. Sensation intact, DTR normal. Strength 5/5 in all 4.  Psychiatric: Normal judgment and insight. Alert and oriented x 3. Normal mood.    Impression and Plan:  Encounter for preventive health examination -Recommend routine eye and dental care. -Immunizations: First shingles vaccine in office today, she will get COVID booster at pharmacy.  Other immunizations are up-to-date -Healthy lifestyle discussed in detail. -Labs to be updated today. -Colon cancer screening: Referral placed today -Breast cancer screening: Referral placed today -Cervical cancer screening: Referral placed today -Lung cancer screening: Not applicable -Prostate cancer screening: Not applicable -DEXA: Not applicable  Screening for malignant neoplasm of colon  - Plan: Ambulatory referral to Gastroenterology  Screening mammogram for breast cancer  - Plan: MM Digital Screening  Primary hypertension -Blood pressure is well controlled at 110/80.  Continue amlodipine and chlorthalidone.  Hyperlipidemia, unspecified hyperlipidemia type -Check lipids today, on  atorvastatin 10 mg daily.  Morbid obesity ([place]) -Discussed healthy lifestyle, including increased physical activity and better food choices to promote weight loss.  Anxiety Flowsheet Row Office Visit from 01/07/2022 in [place] at [place]  PHQ-9 Total Score 0      -Mood is stable, continue Lexapro  Need for shingles vaccine -First shingles vaccine administered today.    Patient Instructions  -Nice seeing you today!!  -Lab work today; will notify you once results are available.  -Shingles vaccine today.  -Remember your COVID booster.  -Mammogram and colonoscopy will be scheduled. Remember to call GYN.  -Schedule follow up in 6 months.    Lelon Frohlich, MD [facility] at [facility]

## 2022-01-08 ENCOUNTER — Encounter (INDEPENDENT_AMBULATORY_CARE_PROVIDER_SITE_OTHER): Payer: Self-pay | Admitting: *Deleted

## 2022-01-12 ENCOUNTER — Encounter: Payer: Self-pay | Admitting: Internal Medicine

## 2022-01-12 ENCOUNTER — Other Ambulatory Visit: Payer: Self-pay | Admitting: Internal Medicine

## 2022-01-12 DIAGNOSIS — R7302 Impaired glucose tolerance (oral): Secondary | ICD-10-CM | POA: Insufficient documentation

## 2022-01-12 DIAGNOSIS — R7989 Other specified abnormal findings of blood chemistry: Secondary | ICD-10-CM

## 2022-01-12 NOTE — Progress Notes (Signed)
1. B12 low normal. Needs to be on monthly IM B12 or daily PO 1000 mcg.  2. A1c makes her a prediabetic. Needs to start Lifestyle modifications: healthy eating, weight loss, increased physical activity. Recheck lipids in 6 months.  3. Cholesterol is higher than last check, but still better than before starting statin. Ok to continue current dose of atorvastatin.  4. TSH is high. Could mean thyroid is underfunctioning. Please add free T3 and free T4.  Needs a 6 mo follow up. Is she interested in [facility]? If so, please refer.

## 2022-01-15 ENCOUNTER — Other Ambulatory Visit (INDEPENDENT_AMBULATORY_CARE_PROVIDER_SITE_OTHER): Payer: 59

## 2022-01-15 DIAGNOSIS — R7989 Other specified abnormal findings of blood chemistry: Secondary | ICD-10-CM | POA: Diagnosis not present

## 2022-01-15 LAB — T3, FREE: T3, Free: 3.2 pg/mL (ref 2.3–4.2)

## 2022-01-15 LAB — T4, FREE: Free T4: 0.59 ng/dL — ABNORMAL LOW (ref 0.60–1.60)

## 2022-01-18 ENCOUNTER — Other Ambulatory Visit: Payer: Self-pay | Admitting: Internal Medicine

## 2022-01-18 ENCOUNTER — Encounter: Payer: Self-pay | Admitting: Internal Medicine

## 2022-01-18 ENCOUNTER — Other Ambulatory Visit (HOSPITAL_COMMUNITY): Payer: Self-pay

## 2022-01-18 DIAGNOSIS — E039 Hypothyroidism, unspecified: Secondary | ICD-10-CM

## 2022-01-18 MED ORDER — LEVOTHYROXINE SODIUM 25 MCG PO TABS
25.0000 ug | ORAL_TABLET | Freq: Every day | ORAL | 1 refills | Status: DC
  Filled 2022-01-18 – 2022-01-19 (×2): qty 30, 30d supply, fill #0
  Filled 2022-02-17: qty 30, 30d supply, fill #1
  Filled 2022-03-19: qty 30, 30d supply, fill #2
  Filled 2022-04-27: qty 30, 30d supply, fill #3
  Filled 2022-05-31 – 2022-06-08 (×2): qty 30, 30d supply, fill #4
  Filled 2022-06-23: qty 30, 30d supply, fill #5

## 2022-01-19 ENCOUNTER — Other Ambulatory Visit (HOSPITAL_COMMUNITY): Payer: Self-pay

## 2022-01-25 ENCOUNTER — Other Ambulatory Visit (HOSPITAL_COMMUNITY): Payer: Self-pay

## 2022-01-25 ENCOUNTER — Other Ambulatory Visit: Payer: Self-pay | Admitting: Internal Medicine

## 2022-01-25 MED ORDER — AMLODIPINE BESYLATE 5 MG PO TABS
ORAL_TABLET | Freq: Every day | ORAL | 1 refills | Status: DC
  Filled 2022-01-25: qty 30, 30d supply, fill #0
  Filled 2022-02-26: qty 30, 30d supply, fill #1
  Filled 2022-04-02: qty 30, 30d supply, fill #2
  Filled 2022-05-03: qty 30, 30d supply, fill #3
  Filled 2022-06-11: qty 30, 30d supply, fill #4
  Filled 2022-07-13: qty 30, 30d supply, fill #5

## 2022-02-02 ENCOUNTER — Other Ambulatory Visit (HOSPITAL_COMMUNITY): Payer: Self-pay

## 2022-02-17 ENCOUNTER — Other Ambulatory Visit (HOSPITAL_COMMUNITY): Payer: Self-pay

## 2022-02-26 ENCOUNTER — Other Ambulatory Visit (HOSPITAL_COMMUNITY): Payer: Self-pay

## 2022-03-01 ENCOUNTER — Other Ambulatory Visit (INDEPENDENT_AMBULATORY_CARE_PROVIDER_SITE_OTHER): Payer: 59

## 2022-03-01 DIAGNOSIS — E039 Hypothyroidism, unspecified: Secondary | ICD-10-CM

## 2022-03-01 LAB — TSH: TSH: 3.22 u[IU]/mL (ref 0.35–5.50)

## 2022-03-19 ENCOUNTER — Other Ambulatory Visit (HOSPITAL_COMMUNITY): Payer: Self-pay

## 2022-03-23 ENCOUNTER — Other Ambulatory Visit: Payer: Self-pay | Admitting: Internal Medicine

## 2022-03-23 ENCOUNTER — Other Ambulatory Visit (HOSPITAL_COMMUNITY): Payer: Self-pay

## 2022-03-23 MED ORDER — CHLORTHALIDONE 50 MG PO TABS
ORAL_TABLET | ORAL | 1 refills | Status: DC
  Filled 2022-03-23: qty 30, 30d supply, fill #0
  Filled 2022-06-11: qty 30, 30d supply, fill #1
  Filled 2022-08-05: qty 30, 30d supply, fill #2
  Filled 2022-10-12: qty 30, 30d supply, fill #3
  Filled 2022-12-21: qty 30, 30d supply, fill #4
  Filled 2023-03-01: qty 30, 30d supply, fill #5

## 2022-04-02 ENCOUNTER — Other Ambulatory Visit (HOSPITAL_COMMUNITY): Payer: Self-pay

## 2022-04-27 ENCOUNTER — Other Ambulatory Visit: Payer: Self-pay | Admitting: Internal Medicine

## 2022-04-27 ENCOUNTER — Other Ambulatory Visit (HOSPITAL_COMMUNITY): Payer: Self-pay

## 2022-04-27 MED ORDER — ESCITALOPRAM OXALATE 10 MG PO TABS
ORAL_TABLET | Freq: Every day | ORAL | 0 refills | Status: DC
  Filled 2022-04-27: qty 30, 30d supply, fill #0
  Filled 2022-05-31: qty 30, 30d supply, fill #1
  Filled 2022-07-05: qty 30, 30d supply, fill #2

## 2022-05-03 ENCOUNTER — Other Ambulatory Visit (HOSPITAL_COMMUNITY): Payer: Self-pay

## 2022-05-03 ENCOUNTER — Other Ambulatory Visit: Payer: Self-pay | Admitting: Internal Medicine

## 2022-05-03 DIAGNOSIS — E785 Hyperlipidemia, unspecified: Secondary | ICD-10-CM

## 2022-05-03 MED ORDER — ATORVASTATIN CALCIUM 10 MG PO TABS
10.0000 mg | ORAL_TABLET | Freq: Every day | ORAL | 1 refills | Status: DC
  Filled 2022-05-03: qty 30, 30d supply, fill #0
  Filled 2022-06-11: qty 30, 30d supply, fill #1
  Filled 2022-07-13: qty 30, 30d supply, fill #2
  Filled 2022-08-17: qty 30, 30d supply, fill #3
  Filled 2022-09-17: qty 30, 30d supply, fill #4
  Filled 2022-10-12: qty 30, 30d supply, fill #5

## 2022-05-31 ENCOUNTER — Other Ambulatory Visit (HOSPITAL_COMMUNITY): Payer: Self-pay

## 2022-06-08 ENCOUNTER — Other Ambulatory Visit (HOSPITAL_COMMUNITY): Payer: Self-pay

## 2022-06-11 ENCOUNTER — Other Ambulatory Visit (HOSPITAL_COMMUNITY): Payer: Self-pay

## 2022-06-23 ENCOUNTER — Other Ambulatory Visit (HOSPITAL_COMMUNITY): Payer: Self-pay

## 2022-06-24 ENCOUNTER — Other Ambulatory Visit (HOSPITAL_COMMUNITY): Payer: Self-pay

## 2022-07-05 ENCOUNTER — Other Ambulatory Visit (HOSPITAL_COMMUNITY): Payer: Self-pay

## 2022-07-07 ENCOUNTER — Ambulatory Visit: Payer: 59 | Admitting: Internal Medicine

## 2022-07-13 ENCOUNTER — Other Ambulatory Visit (HOSPITAL_COMMUNITY): Payer: Self-pay

## 2022-08-05 ENCOUNTER — Other Ambulatory Visit: Payer: Self-pay | Admitting: Internal Medicine

## 2022-08-05 ENCOUNTER — Other Ambulatory Visit (HOSPITAL_COMMUNITY): Payer: Self-pay

## 2022-08-05 DIAGNOSIS — E039 Hypothyroidism, unspecified: Secondary | ICD-10-CM

## 2022-08-05 MED ORDER — LEVOTHYROXINE SODIUM 25 MCG PO TABS
25.0000 ug | ORAL_TABLET | Freq: Every day | ORAL | 1 refills | Status: DC
  Filled 2022-08-05: qty 30, 30d supply, fill #0
  Filled 2022-09-06: qty 30, 30d supply, fill #1
  Filled 2022-10-12: qty 30, 30d supply, fill #2
  Filled 2022-11-12 – 2022-11-19 (×2): qty 30, 30d supply, fill #3
  Filled 2022-12-21: qty 30, 30d supply, fill #4
  Filled 2023-01-25: qty 30, 30d supply, fill #5

## 2022-08-05 MED ORDER — ESCITALOPRAM OXALATE 10 MG PO TABS
ORAL_TABLET | Freq: Every day | ORAL | 0 refills | Status: DC
  Filled 2022-08-05: qty 30, 30d supply, fill #0
  Filled 2022-09-06: qty 30, 30d supply, fill #1
  Filled 2022-10-12: qty 30, 30d supply, fill #2

## 2022-08-09 ENCOUNTER — Encounter: Payer: Self-pay | Admitting: Internal Medicine

## 2022-08-09 ENCOUNTER — Ambulatory Visit: Payer: 59 | Admitting: Internal Medicine

## 2022-08-09 VITALS — BP 110/80 | HR 82 | Temp 97.9°F | Wt 202.1 lb

## 2022-08-09 DIAGNOSIS — R7302 Impaired glucose tolerance (oral): Secondary | ICD-10-CM | POA: Diagnosis not present

## 2022-08-09 DIAGNOSIS — E785 Hyperlipidemia, unspecified: Secondary | ICD-10-CM | POA: Diagnosis not present

## 2022-08-09 DIAGNOSIS — Z23 Encounter for immunization: Secondary | ICD-10-CM

## 2022-08-09 DIAGNOSIS — I10 Essential (primary) hypertension: Secondary | ICD-10-CM

## 2022-08-09 DIAGNOSIS — E538 Deficiency of other specified B group vitamins: Secondary | ICD-10-CM

## 2022-08-09 DIAGNOSIS — E039 Hypothyroidism, unspecified: Secondary | ICD-10-CM

## 2022-08-09 LAB — COMPREHENSIVE METABOLIC PANEL
ALT: 31 U/L (ref 0–35)
AST: 21 U/L (ref 0–37)
Albumin: 4.4 g/dL (ref 3.5–5.2)
Alkaline Phosphatase: 72 U/L (ref 39–117)
BUN: 18 mg/dL (ref 6–23)
CO2: 29 mEq/L (ref 19–32)
Calcium: 10.1 mg/dL (ref 8.4–10.5)
Chloride: 96 mEq/L (ref 96–112)
Creatinine, Ser: 0.81 mg/dL (ref 0.40–1.20)
GFR: 83.46 mL/min (ref >60.00)
Glucose, Bld: 106 mg/dL — ABNORMAL HIGH (ref 70–99)
Potassium: 3.5 mEq/L (ref 3.5–5.1)
Sodium: 137 mEq/L (ref 135–145)
Total Bilirubin: 0.4 mg/dL (ref 0.2–1.2)
Total Protein: 8.9 g/dL — ABNORMAL HIGH (ref 6.0–8.3)

## 2022-08-09 LAB — LIPID PANEL
Cholesterol: 167 mg/dL (ref 0–200)
HDL: 44.9 mg/dL (ref >39.00)
LDL Cholesterol: 103 mg/dL — ABNORMAL HIGH (ref 0–99)
NonHDL: 122.46
Total CHOL/HDL Ratio: 4
Triglycerides: 96 mg/dL (ref 0.0–149.0)
VLDL: 19.2 mg/dL (ref 0.0–40.0)

## 2022-08-09 LAB — POCT GLYCOSYLATED HEMOGLOBIN (HGB A1C): Hemoglobin A1C: 6.1 % — AB (ref 4.0–5.6)

## 2022-08-09 LAB — VITAMIN B12: Vitamin B-12: 1041 pg/mL — ABNORMAL HIGH (ref 211–911)

## 2022-08-09 LAB — TSH: TSH: 4.23 u[IU]/mL (ref 0.35–5.50)

## 2022-08-09 NOTE — Progress Notes (Signed)
Established Patient Office Visit     CC/Reason for Visit: 62-month follow-up chronic medical conditions  HPI: Sharon Molina is a 51 y.o. female who is coming in today for the above mentioned reasons. Past Medical History is significant for: Hypertension, hyperlipidemia, impaired glucose tolerance, generalized anxiety disorder, hypothyroidism.  She has been feeling well.  No acute concerns or complaints.  She is requesting her final shingles vaccine today.  Cancer screening is overdue.  She is adherent to medical therapy.   Past Medical/Surgical History: Past Medical History:  Diagnosis Date   Anxiety    Hyperlipidemia    Hypertension    Morbid obesity ([facility])     Past Surgical History:  Procedure Laterality Date   CHOLECYSTECTOMY     MASTOIDECTOMY     tubes in ears      Social History:  reports that she has never smoked. She has never used smokeless tobacco. She reports that she does not drink alcohol and does not use drugs.  Allergies: Allergies  Allergen Reactions   Demerol    Lisinopril     Itching and swelling around eyes   Sulfa Antibiotics     Family History:  Family History  Problem Relation Age of Onset   Hypertension Father    Parkinsonism Father    Kidney cancer Father    Hypertension Mother    Cancer - Other Paternal Grandmother        brain   Heart disease Paternal Grandfather      Current Outpatient Medications:    amLODipine (NORVASC) 5 MG tablet, TAKE 1 TABLET BY MOUTH ONCE DAILY., Disp: 90 tablet, Rfl: 1   atorvastatin (LIPITOR) 10 MG tablet, Take 1 tablet by mouth daily., Disp: 90 tablet, Rfl: 1   cetirizine (ZYRTEC) 10 MG tablet, Take 10 mg by mouth daily., Disp: , Rfl:    chlorthalidone (HYGROTON) 25 MG tablet, Take by mouth., Disp: , Rfl:    chlorthalidone (HYGROTON) 50 MG tablet, TAKE 1/2 TABLET BY MOUTH DAILY. MAY TAKE 1 TABLET IF SWELLING., Disp: 90 tablet, Rfl: 1   Cholecalciferol (QC VITAMIN D3) 50 MCG (2000 UT) TABS,  Take 1 tablet by mouth daily. , Disp: , Rfl:    escitalopram (LEXAPRO) 10 MG tablet, TAKE 1 TABLET BY MOUTH ONCE DAILY., Disp: 90 tablet, Rfl: 0   levothyroxine (SYNTHROID) 25 MCG tablet, Take 1 tablet (25 mcg total) by mouth daily., Disp: 90 tablet, Rfl: 1  Review of Systems:  Constitutional: Denies fever, chills, diaphoresis, appetite change and fatigue.  HEENT: Denies photophobia, eye pain, redness, hearing loss, ear pain, congestion, sore throat, rhinorrhea, sneezing, mouth sores, trouble swallowing, neck pain, neck stiffness and tinnitus.   Respiratory: Denies SOB, DOE, cough, chest tightness,  and wheezing.   Cardiovascular: Denies chest pain, palpitations and leg swelling.  Gastrointestinal: Denies nausea, vomiting, abdominal pain, diarrhea, constipation, blood in stool and abdominal distention.  Genitourinary: Denies dysuria, urgency, frequency, hematuria, flank pain and difficulty urinating.  Endocrine: Denies: hot or cold intolerance, sweats, changes in hair or nails, polyuria, polydipsia. Musculoskeletal: Denies myalgias, back pain, joint swelling, arthralgias and gait problem.  Skin: Denies pallor, rash and wound.  Neurological: Denies dizziness, seizures, syncope, weakness, light-headedness, numbness and headaches.  Hematological: Denies adenopathy. Easy bruising, personal or family bleeding history  Psychiatric/Behavioral: Denies suicidal ideation, mood changes, confusion, nervousness, sleep disturbance and agitation    Physical Exam: Vitals:   08/09/22 0709  BP: 110/80  Pulse: 82  Temp: 97.9 F (36.6 C)  TempSrc: Oral  SpO2: 98%  Weight: 202 lb 1.6 oz (91.7 kg)    Body mass index is 39.47 kg/m.   Constitutional: NAD, calm, comfortable Eyes: PERRL, lids and conjunctivae normal ENMT: Mucous membranes are moist.  Respiratory: clear to auscultation bilaterally, no wheezing, no crackles. Normal respiratory effort. No accessory muscle use.  Cardiovascular: Regular  rate and rhythm, no murmurs / rubs / gallops. No extremity edema. Psychiatric: Normal judgment and insight. Alert and oriented x 3. Normal mood.    Impression and Plan:  IGT (impaired glucose tolerance) - Plan: POCT glycosylated hemoglobin (Hb A1C)  Hypothyroidism (acquired) - Plan: TSH  Primary hypertension  Morbid obesity ([place])  Hyperlipidemia, unspecified hyperlipidemia type - Plan: Comprehensive metabolic panel, Lipid panel  Vitamin B12 deficiency - Plan: Vitamin B12  Need for shingles vaccine  -Second shingles vaccine administered today. -A1c shows stability at 6.1, she will continue to work on lifestyle changes. -Blood pressure is well controlled. -Check lipids today, currently on atorvastatin 10 mg. -She was most recently diagnosed with hypothyroidism, recheck TSH today. -She will make appointment for screening colonoscopy as she is overdue. -She has appointment with gynecology in October for Pap smear and mammogram.   Time spent:32 minutes reviewing chart, interviewing and examining patient and formulating plan of care.     Lelon Frohlich, MD [facility] at [facility]

## 2022-08-09 NOTE — Addendum Note (Signed)
Addended by: Westley Hummer B on: 08/09/2022 08:05 AM   Modules accepted: Orders

## 2022-08-17 ENCOUNTER — Other Ambulatory Visit (HOSPITAL_COMMUNITY): Payer: Self-pay

## 2022-08-17 ENCOUNTER — Other Ambulatory Visit: Payer: Self-pay | Admitting: Internal Medicine

## 2022-08-17 MED ORDER — AMLODIPINE BESYLATE 5 MG PO TABS
5.0000 mg | ORAL_TABLET | Freq: Every day | ORAL | 1 refills | Status: DC
  Filled 2022-08-17: qty 30, 30d supply, fill #0
  Filled 2022-09-17: qty 30, 30d supply, fill #1
  Filled 2022-10-12: qty 30, 30d supply, fill #2
  Filled 2022-11-12: qty 30, 30d supply, fill #3
  Filled 2022-12-21: qty 30, 30d supply, fill #4
  Filled 2023-01-25: qty 30, 30d supply, fill #5

## 2022-09-06 ENCOUNTER — Other Ambulatory Visit (HOSPITAL_COMMUNITY): Payer: Self-pay

## 2022-09-14 ENCOUNTER — Ambulatory Visit: Payer: 59 | Admitting: Obstetrics & Gynecology

## 2022-09-17 ENCOUNTER — Other Ambulatory Visit (HOSPITAL_COMMUNITY): Payer: Self-pay

## 2022-10-12 ENCOUNTER — Other Ambulatory Visit (HOSPITAL_COMMUNITY): Payer: Self-pay

## 2022-10-15 ENCOUNTER — Encounter: Payer: Self-pay | Admitting: Obstetrics & Gynecology

## 2022-10-15 ENCOUNTER — Other Ambulatory Visit (HOSPITAL_COMMUNITY)
Admission: RE | Admit: 2022-10-15 | Discharge: 2022-10-15 | Disposition: A | Discharge status: Home / Self Care | Payer: 59 | Source: Ambulatory Visit | Attending: Obstetrics & Gynecology | Admitting: Obstetrics & Gynecology

## 2022-10-15 ENCOUNTER — Ambulatory Visit (INDEPENDENT_AMBULATORY_CARE_PROVIDER_SITE_OTHER): Payer: 59 | Admitting: Obstetrics & Gynecology

## 2022-10-15 VITALS — BP 115/82 | HR 105 | Ht 59.0 in | Wt 205.0 lb

## 2022-10-15 DIAGNOSIS — Z1231 Encounter for screening mammogram for malignant neoplasm of breast: Secondary | ICD-10-CM | POA: Diagnosis not present

## 2022-10-15 DIAGNOSIS — Z01419 Encounter for gynecological examination (general) (routine) without abnormal findings: Secondary | ICD-10-CM | POA: Insufficient documentation

## 2022-10-15 NOTE — Progress Notes (Signed)
Subjective:     Sharon Molina is a 51 y.o. female here for a routine exam.  Patient's last menstrual period was 07/16/2020 (approximate). [id number] Birth Control Method:  post menopausal Menstrual Calendar(currently): amenorrheic  Current complaints: none.   Current acute medical issues:  none   Recent Gynecologic History Patient's last menstrual period was 07/16/2020 (approximate). Last Pap: 2017,  normal Last mammogram: ordered,    Past Medical History:  Diagnosis Date   Anxiety    Hyperlipidemia    Hypertension    Morbid obesity ([facility])     Past Surgical History:  Procedure Laterality Date   CHOLECYSTECTOMY     MASTOIDECTOMY     tubes in ears      OB History     Gravida  0   Para  0   Term  0   Preterm  0   AB  0   Living  0      SAB  0   IAB  0   Ectopic  0   Multiple  0   Live Births  0           Social History   Socioeconomic History   Marital status: Married    Spouse name: Not on file   Number of children: Not on file   Years of education: Not on file   Highest education level: Bachelor's degree (e.g., BA, AB, BS)  Occupational History   Occupation: RN  Tobacco Use   Smoking status: Never   Smokeless tobacco: Never  Vaping Use   Vaping Use: Never used  Substance and Sexual Activity   Alcohol use: No   Drug use: No   Sexual activity: Yes    Partners: Male    Birth control/protection: Surgical  Other Topics Concern   Not on file  Social History Narrative   Not on file   Social Determinants of Health   Financial Resource Strain: Low Risk  (10/15/2022)   Overall Financial Resource Strain (CARDIA)    Difficulty of Paying Living Expenses: Not hard at all  Food Insecurity: No Food Insecurity (10/15/2022)   Hunger Vital Sign    Worried About Running Out of Food in the Last Year: Never true    Ran Out of Food in the Last Year: Never true  Transportation Needs: No Transportation Needs (10/15/2022)   PRAPARE -  Administrator, Civil Service (Medical): No    Lack of Transportation (Non-Medical): No  Physical Activity: Unknown (10/15/2022)   Exercise Vital Sign    Days of Exercise per Week: Patient refused    Minutes of Exercise per Session: Patient refused  Recent Concern: Physical Activity - Insufficiently Active (08/08/2022)   Exercise Vital Sign    Days of Exercise per Week: 1 day    Minutes of Exercise per Session: 30 min  Stress: No Stress Concern Present (10/15/2022)   [employer] of Occupational Health - Occupational Stress Questionnaire    Feeling of Stress : Not at all  Social Connections: Moderately Integrated (10/15/2022)   Social Connection and Isolation Panel [NHANES]    Frequency of Communication with Friends and Family: More than three times a week    Frequency of Social Gatherings with Friends and Family: More than three times a week    Attends Religious Services: More than 4 times per year    Active Member of [employer] or Organizations: No    Attends Banker Meetings: Never    Marital Status:  Married    Family History  Problem Relation Age of Onset   Hypertension Father    Parkinsonism Father    Kidney cancer Father    Hypertension Mother    Cancer - Other Paternal Grandmother        brain   Heart disease Paternal Grandfather      Current Outpatient Medications:    amLODipine (NORVASC) 5 MG tablet, Take 1 tablet (5 mg total) by mouth daily., Disp: 90 tablet, Rfl: 1   atorvastatin (LIPITOR) 10 MG tablet, Take 1 tablet by mouth daily., Disp: 90 tablet, Rfl: 1   cetirizine (ZYRTEC) 10 MG tablet, Take 10 mg by mouth daily., Disp: , Rfl:    chlorthalidone (HYGROTON) 25 MG tablet, Take by mouth., Disp: , Rfl:    Cholecalciferol (QC VITAMIN D3) 50 MCG (2000 UT) TABS, Take 1 tablet by mouth daily. , Disp: , Rfl:    escitalopram (LEXAPRO) 10 MG tablet, TAKE 1 TABLET BY MOUTH ONCE DAILY., Disp: 90 tablet, Rfl: 0   levothyroxine (SYNTHROID) 25 MCG  tablet, Take 1 tablet (25 mcg total) by mouth daily., Disp: 90 tablet, Rfl: 1   chlorthalidone (HYGROTON) 50 MG tablet, TAKE 1/2 TABLET BY MOUTH DAILY. MAY TAKE 1 TABLET IF SWELLING. (Patient not taking: Reported on 10/15/2022), Disp: 90 tablet, Rfl: 1  Review of Systems  Review of Systems  Constitutional: Negative for fever, chills, weight loss, malaise/fatigue and diaphoresis.  HENT: Negative for hearing loss, ear pain, nosebleeds, congestion, sore throat, neck pain, tinnitus and ear discharge.   Eyes: Negative for blurred vision, double vision, photophobia, pain, discharge and redness.  Respiratory: Negative for cough, hemoptysis, sputum production, shortness of breath, wheezing and stridor.   Cardiovascular: Negative for chest pain, palpitations, orthopnea, claudication, leg swelling and PND.  Gastrointestinal: negative for abdominal pain. Negative for heartburn, nausea, vomiting, diarrhea, constipation, blood in stool and melena.  Genitourinary: Negative for dysuria, urgency, frequency, hematuria and flank pain.  Musculoskeletal: Negative for myalgias, back pain, joint pain and falls.  Skin: Negative for itching and rash.  Neurological: Negative for dizziness, tingling, tremors, sensory change, speech change, focal weakness, seizures, loss of consciousness, weakness and headaches.  Endo/Heme/Allergies: Negative for environmental allergies and polydipsia. Does not bruise/bleed easily.  Psychiatric/Behavioral: Negative for depression, suicidal ideas, hallucinations, memory loss and substance abuse. The patient is not nervous/anxious and does not have insomnia.        Objective:  Blood pressure 115/82, pulse (!) 105, height 4\' 11"  (1.499 m), weight 205 lb (93 kg), last menstrual period 07/16/2020.   Physical Exam  Vitals reviewed. Constitutional: She is oriented to person, place, and time. She appears well-developed and well-nourished.  HENT:  Head: Normocephalic and atraumatic.         Right Ear: External ear normal.  Left Ear: External ear normal.  Nose: Nose normal.  Mouth/Throat: Oropharynx is clear and moist.  Eyes: Conjunctivae and EOM are normal. Pupils are equal, round, and reactive to light. Right eye exhibits no discharge. Left eye exhibits no discharge. No scleral icterus.  Neck: Normal range of motion. Neck supple. No tracheal deviation present. No thyromegaly present.  Cardiovascular: Normal rate, regular rhythm, normal heart sounds and intact distal pulses.  Exam reveals no gallop and no friction rub.   No murmur heard. Respiratory: Effort normal and breath sounds normal. No respiratory distress. She has no wheezes. She has no rales. She exhibits no tenderness.  GI: Soft. Bowel sounds are normal. She exhibits no distension and no mass.  There is no tenderness. There is no rebound and no guarding.  Genitourinary:  Breasts no masses skin changes or nipple changes bilaterally      Vulva is normal without lesions Vagina is pink moist without discharge Cervix normal in appearance and pap is done Uterus is normal size shape and contour Adnexa is negative with normal sized ovaries   Musculoskeletal: Normal range of motion. She exhibits no edema and no tenderness.  Neurological: She is alert and oriented to person, place, and time. She has normal reflexes. She displays normal reflexes. No cranial nerve deficit. She exhibits normal muscle tone. Coordination normal.  Skin: Skin is warm and dry. No rash noted. No erythema. No pallor.  Psychiatric: She has a normal mood and affect. Her behavior is normal. Judgment and thought content normal.       Medications Ordered at today's visit: No orders of the defined types were placed in this encounter.   Other orders placed at today's visit: Orders Placed This Encounter  Procedures   MM 3D SCREEN BREAST BILATERAL      Assessment:    Normal Gyn exam.    Plan:    Mammogram ordered. Follow up in: 3 years.      No follow-ups on file.

## 2022-10-18 LAB — CYTOLOGY - PAP
Comment: NEGATIVE
Diagnosis: NEGATIVE
High risk HPV: NEGATIVE

## 2022-11-12 ENCOUNTER — Other Ambulatory Visit: Payer: Self-pay

## 2022-11-12 ENCOUNTER — Other Ambulatory Visit (HOSPITAL_COMMUNITY): Payer: Self-pay

## 2022-11-12 ENCOUNTER — Other Ambulatory Visit: Payer: Self-pay | Admitting: Internal Medicine

## 2022-11-12 DIAGNOSIS — E785 Hyperlipidemia, unspecified: Secondary | ICD-10-CM

## 2022-11-16 ENCOUNTER — Encounter (HOSPITAL_COMMUNITY): Payer: Self-pay

## 2022-11-16 ENCOUNTER — Other Ambulatory Visit (HOSPITAL_COMMUNITY): Payer: Self-pay

## 2022-11-16 ENCOUNTER — Other Ambulatory Visit: Payer: Self-pay

## 2022-11-16 MED ORDER — ESCITALOPRAM OXALATE 10 MG PO TABS
10.0000 mg | ORAL_TABLET | Freq: Every day | ORAL | 1 refills | Status: DC
  Filled 2022-11-16: qty 30, 30d supply, fill #0
  Filled 2022-12-21: qty 30, 30d supply, fill #1
  Filled 2023-01-25: qty 30, 30d supply, fill #2
  Filled 2023-03-01: qty 30, 30d supply, fill #3
  Filled 2023-04-04: qty 30, 30d supply, fill #4
  Filled 2023-05-05: qty 30, 30d supply, fill #5

## 2022-11-16 MED ORDER — ATORVASTATIN CALCIUM 10 MG PO TABS
10.0000 mg | ORAL_TABLET | Freq: Every day | ORAL | 1 refills | Status: DC
  Filled 2022-11-16: qty 30, 30d supply, fill #0
  Filled 2022-12-21: qty 30, 30d supply, fill #1
  Filled 2023-01-25: qty 30, 30d supply, fill #2
  Filled 2023-03-01: qty 30, 30d supply, fill #3
  Filled 2023-04-04: qty 30, 30d supply, fill #4
  Filled 2023-05-05: qty 30, 30d supply, fill #5

## 2022-11-17 ENCOUNTER — Encounter (HOSPITAL_COMMUNITY): Payer: Self-pay

## 2022-11-17 ENCOUNTER — Other Ambulatory Visit (HOSPITAL_COMMUNITY): Payer: Self-pay

## 2022-11-18 ENCOUNTER — Other Ambulatory Visit: Payer: Self-pay

## 2022-11-18 ENCOUNTER — Other Ambulatory Visit (HOSPITAL_COMMUNITY): Payer: Self-pay

## 2022-11-19 ENCOUNTER — Other Ambulatory Visit (HOSPITAL_COMMUNITY): Payer: Self-pay

## 2022-12-21 ENCOUNTER — Other Ambulatory Visit (HOSPITAL_COMMUNITY): Payer: Self-pay

## 2023-01-25 ENCOUNTER — Other Ambulatory Visit (HOSPITAL_COMMUNITY): Payer: Self-pay

## 2023-02-15 ENCOUNTER — Encounter: Payer: 59 | Admitting: Internal Medicine

## 2023-02-15 DIAGNOSIS — E039 Hypothyroidism, unspecified: Secondary | ICD-10-CM

## 2023-02-15 DIAGNOSIS — E785 Hyperlipidemia, unspecified: Secondary | ICD-10-CM

## 2023-02-15 DIAGNOSIS — E538 Deficiency of other specified B group vitamins: Secondary | ICD-10-CM

## 2023-02-15 DIAGNOSIS — R7302 Impaired glucose tolerance (oral): Secondary | ICD-10-CM

## 2023-02-15 DIAGNOSIS — I10 Essential (primary) hypertension: Secondary | ICD-10-CM

## 2023-02-15 DIAGNOSIS — Z0000 Encounter for general adult medical examination without abnormal findings: Secondary | ICD-10-CM

## 2023-03-01 ENCOUNTER — Other Ambulatory Visit (HOSPITAL_COMMUNITY): Payer: Self-pay

## 2023-03-01 ENCOUNTER — Other Ambulatory Visit: Payer: Self-pay | Admitting: Internal Medicine

## 2023-03-01 ENCOUNTER — Other Ambulatory Visit: Payer: Self-pay

## 2023-03-01 DIAGNOSIS — E039 Hypothyroidism, unspecified: Secondary | ICD-10-CM

## 2023-03-01 MED ORDER — AMLODIPINE BESYLATE 5 MG PO TABS
5.0000 mg | ORAL_TABLET | Freq: Every day | ORAL | 1 refills | Status: DC
  Filled 2023-03-01: qty 30, 30d supply, fill #0
  Filled 2023-04-04: qty 30, 30d supply, fill #1
  Filled 2023-05-05: qty 30, 30d supply, fill #2
  Filled 2023-06-09: qty 30, 30d supply, fill #3
  Filled 2023-07-14: qty 30, 30d supply, fill #4
  Filled 2023-08-15: qty 30, 30d supply, fill #5

## 2023-03-01 MED ORDER — LEVOTHYROXINE SODIUM 25 MCG PO TABS
25.0000 ug | ORAL_TABLET | Freq: Every day | ORAL | 1 refills | Status: DC
  Filled 2023-03-01: qty 30, 30d supply, fill #0
  Filled 2023-04-04: qty 30, 30d supply, fill #1
  Filled 2023-05-05: qty 30, 30d supply, fill #2
  Filled 2023-06-09: qty 30, 30d supply, fill #3
  Filled 2023-07-14: qty 30, 30d supply, fill #4
  Filled 2023-08-15: qty 30, 30d supply, fill #5

## 2023-04-04 ENCOUNTER — Other Ambulatory Visit (HOSPITAL_COMMUNITY): Payer: Self-pay

## 2023-04-04 ENCOUNTER — Other Ambulatory Visit: Payer: Self-pay | Admitting: Internal Medicine

## 2023-04-04 MED ORDER — CHLORTHALIDONE 50 MG PO TABS
ORAL_TABLET | ORAL | 1 refills | Status: DC
  Filled 2023-04-04: qty 30, 30d supply, fill #0
  Filled 2023-05-05: qty 30, 30d supply, fill #1
  Filled 2023-07-14: qty 30, 30d supply, fill #2
  Filled 2023-08-15: qty 30, 30d supply, fill #3
  Filled 2023-11-03: qty 30, 30d supply, fill #4
  Filled 2023-12-12: qty 30, 30d supply, fill #5

## 2023-04-05 ENCOUNTER — Ambulatory Visit (INDEPENDENT_AMBULATORY_CARE_PROVIDER_SITE_OTHER): Payer: 59 | Admitting: Internal Medicine

## 2023-04-05 ENCOUNTER — Encounter: Payer: Self-pay | Admitting: Internal Medicine

## 2023-04-05 VITALS — BP 110/80 | HR 90 | Temp 98.3°F | Ht 60.0 in | Wt 204.7 lb

## 2023-04-05 DIAGNOSIS — R7989 Other specified abnormal findings of blood chemistry: Secondary | ICD-10-CM

## 2023-04-05 DIAGNOSIS — Z0000 Encounter for general adult medical examination without abnormal findings: Secondary | ICD-10-CM

## 2023-04-05 DIAGNOSIS — R7302 Impaired glucose tolerance (oral): Secondary | ICD-10-CM

## 2023-04-05 DIAGNOSIS — I10 Essential (primary) hypertension: Secondary | ICD-10-CM

## 2023-04-05 DIAGNOSIS — E039 Hypothyroidism, unspecified: Secondary | ICD-10-CM | POA: Diagnosis not present

## 2023-04-05 DIAGNOSIS — E538 Deficiency of other specified B group vitamins: Secondary | ICD-10-CM

## 2023-04-05 DIAGNOSIS — Z1231 Encounter for screening mammogram for malignant neoplasm of breast: Secondary | ICD-10-CM

## 2023-04-05 DIAGNOSIS — Z1159 Encounter for screening for other viral diseases: Secondary | ICD-10-CM

## 2023-04-05 DIAGNOSIS — E782 Mixed hyperlipidemia: Secondary | ICD-10-CM | POA: Diagnosis not present

## 2023-04-05 DIAGNOSIS — Z114 Encounter for screening for human immunodeficiency virus [HIV]: Secondary | ICD-10-CM

## 2023-04-05 LAB — CBC WITH DIFFERENTIAL/PLATELET
Basophils Absolute: 0 10*3/uL (ref 0.0–0.1)
Basophils Relative: 0.5 % (ref 0.0–3.0)
Eosinophils Absolute: 0.2 10*3/uL (ref 0.0–0.7)
Eosinophils Relative: 2.3 % (ref 0.0–5.0)
HCT: 39.3 % (ref 36.0–46.0)
Hemoglobin: 13.1 g/dL (ref 12.0–15.0)
Lymphocytes Relative: 17.2 % (ref 12.0–46.0)
Lymphs Abs: 1.5 10*3/uL (ref 0.7–4.0)
MCHC: 33.3 g/dL (ref 30.0–36.0)
MCV: 85 fl (ref 78.0–100.0)
Monocytes Absolute: 0.5 10*3/uL (ref 0.1–1.0)
Monocytes Relative: 5.6 % (ref 3.0–12.0)
Neutro Abs: 6.5 10*3/uL (ref 1.4–7.7)
Neutrophils Relative %: 74.4 % (ref 43.0–77.0)
Platelets: 293 10*3/uL (ref 150.0–400.0)
RBC: 4.63 Mil/uL (ref 3.87–5.11)
RDW: 15.2 % (ref 11.5–15.5)
WBC: 8.8 10*3/uL (ref 4.0–10.5)

## 2023-04-05 LAB — COMPREHENSIVE METABOLIC PANEL
ALT: 33 U/L (ref 0–35)
AST: 25 U/L (ref 0–37)
Albumin: 4.5 g/dL (ref 3.5–5.2)
Alkaline Phosphatase: 75 U/L (ref 39–117)
BUN: 16 mg/dL (ref 6–23)
CO2: 31 mEq/L (ref 19–32)
Calcium: 10.2 mg/dL (ref 8.4–10.5)
Chloride: 94 mEq/L — ABNORMAL LOW (ref 96–112)
Creatinine, Ser: 0.8 mg/dL (ref 0.40–1.20)
GFR: 84.33 mL/min (ref >60.00)
Glucose, Bld: 100 mg/dL — ABNORMAL HIGH (ref 70–99)
Potassium: 3.8 mEq/L (ref 3.5–5.1)
Sodium: 136 mEq/L (ref 135–145)
Total Bilirubin: 0.4 mg/dL (ref 0.2–1.2)
Total Protein: 8.9 g/dL — ABNORMAL HIGH (ref 6.0–8.3)

## 2023-04-05 LAB — VITAMIN B12: Vitamin B-12: 740 pg/mL (ref 211–911)

## 2023-04-05 LAB — LIPID PANEL
Cholesterol: 172 mg/dL (ref 0–200)
HDL: 50.3 mg/dL (ref >39.00)
LDL Cholesterol: 102 mg/dL — ABNORMAL HIGH (ref 0–99)
NonHDL: 121.44
Total CHOL/HDL Ratio: 3
Triglycerides: 97 mg/dL (ref 0.0–149.0)
VLDL: 19.4 mg/dL (ref 0.0–40.0)

## 2023-04-05 LAB — TSH: TSH: 3.53 u[IU]/mL (ref 0.35–5.50)

## 2023-04-05 LAB — VITAMIN D 25 HYDROXY (VIT D DEFICIENCY, FRACTURES): VITD: 46.55 ng/mL (ref 30.00–100.00)

## 2023-04-05 LAB — HEMOGLOBIN A1C: Hgb A1c MFr Bld: 6.2 % (ref 4.6–6.5)

## 2023-04-05 NOTE — Progress Notes (Signed)
Established Patient Office Visit     CC/Reason for Visit: Annual preventive exam  HPI: Sharon Molina is a 52 y.o. female who is coming in today for the above mentioned reasons. Past Medical History is significant for: Hypertension, hyperlipidemia, morbid obesity, impaired glucose tolerance, seasonal allergies, generalized anxiety disorder.  She is feeling well and has no acute concerns today.  Immunizations are up-to-date.  She is overdue for breast and colon cancer screening.  She saw her GYN and had cervical cancer screening in December.   Past Medical/Surgical History: Past Medical History:  Diagnosis Date   Anxiety    Hyperlipidemia    Hypertension    Morbid obesity (HCC)     Past Surgical History:  Procedure Laterality Date   CHOLECYSTECTOMY     MASTOIDECTOMY     tubes in ears      Social History:  reports that she has never smoked. She has never used smokeless tobacco. She reports that she does not drink alcohol and does not use drugs.  Allergies: Allergies  Allergen Reactions   Demerol    Lisinopril     Itching and swelling around eyes   Sulfa Antibiotics     Family History:  Family History  Problem Relation Age of Onset   Hypertension Father    Parkinsonism Father    Kidney cancer Father    Hypertension Mother    Cancer - Other Paternal Grandmother        brain   Heart disease Paternal Grandfather      Current Outpatient Medications:    amLODipine (NORVASC) 5 MG tablet, Take 1 tablet (5 mg total) by mouth daily., Disp: 90 tablet, Rfl: 1   atorvastatin (LIPITOR) 10 MG tablet, Take 1 tablet by mouth daily., Disp: 90 tablet, Rfl: 1   cetirizine (ZYRTEC) 10 MG tablet, Take 10 mg by mouth daily., Disp: , Rfl:    chlorthalidone (HYGROTON) 25 MG tablet, Take by mouth., Disp: , Rfl:    chlorthalidone (HYGROTON) 50 MG tablet, Take one-half tablet by mouth daily. May take 1 tablet if swelling., Disp: 90 tablet, Rfl: 1   Cholecalciferol (QC  VITAMIN D3) 50 MCG (2000 UT) TABS, Take 1 tablet by mouth daily. , Disp: , Rfl:    escitalopram (LEXAPRO) 10 MG tablet, Take 1 tablet (10 mg total) by mouth daily., Disp: 90 tablet, Rfl: 1   levothyroxine (SYNTHROID) 25 MCG tablet, Take 1 tablet (25 mcg total) by mouth daily., Disp: 90 tablet, Rfl: 1  Review of Systems:  Negative unless indicated in HPI.   Physical Exam: Vitals:   04/05/23 0704  BP: 110/80  Pulse: 90  Temp: 98.3 F (36.8 C)  TempSrc: Oral  SpO2: 97%  Weight: 204 lb 11.2 oz (92.9 kg)  Height: 5' (1.524 m)    Body mass index is 39.98 kg/m.   Physical Exam Vitals reviewed.  Constitutional:      General: She is not in acute distress.    Appearance: Normal appearance. She is not ill-appearing, toxic-appearing or diaphoretic.  HENT:     Head: Normocephalic.     Right Ear: Tympanic membrane, ear canal and external ear normal. There is no impacted cerumen.     Left Ear: Tympanic membrane, ear canal and external ear normal. There is no impacted cerumen.     Nose: Nose normal.     Mouth/Throat:     Mouth: Mucous membranes are moist.     Pharynx: Oropharynx is clear. No oropharyngeal exudate  or posterior oropharyngeal erythema.  Eyes:     General: No scleral icterus.       Right eye: No discharge.        Left eye: No discharge.     Conjunctiva/sclera: Conjunctivae normal.     Pupils: Pupils are equal, round, and reactive to light.  Neck:     Vascular: No carotid bruit.  Cardiovascular:     Rate and Rhythm: Normal rate and regular rhythm.     Pulses: Normal pulses.     Heart sounds: Normal heart sounds.  Pulmonary:     Effort: Pulmonary effort is normal. No respiratory distress.     Breath sounds: Normal breath sounds.  Abdominal:     General: Abdomen is flat. Bowel sounds are normal.     Palpations: Abdomen is soft.  Musculoskeletal:        General: Normal range of motion.     Cervical back: Normal range of motion.  Skin:    General: Skin is warm and  dry.  Neurological:     General: No focal deficit present.     Mental Status: She is alert and oriented to person, place, and time. Mental status is at baseline.  Psychiatric:        Mood and Affect: Mood normal.        Behavior: Behavior normal.        Thought Content: Thought content normal.        Judgment: Judgment normal.     Flowsheet Row Office Visit from 04/05/2023 in [facility] [place]  PHQ-9 Total Score 0        Impression and Plan:  Encounter for preventive health examination  IGT (impaired glucose tolerance) -     Hemoglobin A1c; Future  Hypothyroidism (acquired)  Primary hypertension -     CBC with Differential/Platelet; Future -     Comprehensive metabolic panel; Future  Vitamin B12 deficiency -     Vitamin B12; Future  Abnormal TSH -     TSH; Future  Encounter for screening mammogram for malignant neoplasm of breast -     Digital Screening Mammogram, Left and Right; Future  Encounter for hepatitis C screening test for low risk patient -     Hepatitis C antibody; Future  Encounter for screening for HIV -     HIV Antibody (routine testing w rflx); Future  Mixed hyperlipidemia -     Lipid panel; Future  Morbid obesity (HCC) -     VITAMIN D 25 Hydroxy (Vit-D Deficiency, Fractures); Future  -Recommend routine eye and dental care. -Healthy lifestyle discussed in detail. -Labs to be updated today. -Prostate cancer screening: N/A Health Maintenance  Topic Date Due   HIV Screening  Never done   Hepatitis C Screening: USPSTF Recommendation to screen - Ages 4-79 yo.  Never done   Colon Cancer Screening  Never done   Mammogram  03/09/2020   COVID-19 Vaccine (3 - 2023-24 season) 07/16/2022   Flu Shot  06/16/2023   Pap Smear  10/15/2025   DTaP/Tdap/Td vaccine (2 - Td or Tdap) 08/15/2030   Zoster (Shingles) Vaccine  Completed   HPV Vaccine  Aged Out     -Sent for mammogram and screening colonoscopy, recommended eye  exam.     Chaya Jan, MD  Primary Care at [facility]

## 2023-04-06 LAB — HIV ANTIBODY (ROUTINE TESTING W REFLEX): HIV 1&2 Ab, 4th Generation: NONREACTIVE

## 2023-04-06 LAB — HEPATITIS C ANTIBODY: Hepatitis C Ab: NONREACTIVE

## 2023-05-05 ENCOUNTER — Other Ambulatory Visit (HOSPITAL_COMMUNITY): Payer: Self-pay

## 2023-06-09 ENCOUNTER — Other Ambulatory Visit: Payer: Self-pay

## 2023-06-09 ENCOUNTER — Other Ambulatory Visit: Payer: Self-pay | Admitting: Internal Medicine

## 2023-06-09 ENCOUNTER — Other Ambulatory Visit (HOSPITAL_COMMUNITY): Payer: Self-pay

## 2023-06-09 DIAGNOSIS — E785 Hyperlipidemia, unspecified: Secondary | ICD-10-CM

## 2023-06-09 MED ORDER — ATORVASTATIN CALCIUM 10 MG PO TABS
10.0000 mg | ORAL_TABLET | Freq: Every day | ORAL | 1 refills | Status: DC
Start: 2023-06-09 — End: 2024-01-23
  Filled 2023-06-09: qty 30, 30d supply, fill #0
  Filled 2023-07-14: qty 30, 30d supply, fill #1
  Filled 2023-08-15: qty 30, 30d supply, fill #2
  Filled 2023-09-22: qty 30, 30d supply, fill #3
  Filled 2023-11-03: qty 30, 30d supply, fill #4
  Filled 2023-12-12: qty 30, 30d supply, fill #5

## 2023-06-09 MED ORDER — ESCITALOPRAM OXALATE 10 MG PO TABS
10.0000 mg | ORAL_TABLET | Freq: Every day | ORAL | 1 refills | Status: DC
  Filled 2023-06-09: qty 30, 30d supply, fill #0
  Filled 2023-07-14: qty 30, 30d supply, fill #1
  Filled 2023-08-15: qty 30, 30d supply, fill #2
  Filled 2023-09-22: qty 30, 30d supply, fill #3
  Filled 2023-11-03: qty 30, 30d supply, fill #4
  Filled 2023-12-12: qty 30, 30d supply, fill #5

## 2023-07-14 ENCOUNTER — Other Ambulatory Visit (HOSPITAL_COMMUNITY): Payer: Self-pay

## 2023-08-15 ENCOUNTER — Other Ambulatory Visit (HOSPITAL_COMMUNITY): Payer: Self-pay

## 2023-08-19 ENCOUNTER — Other Ambulatory Visit: Payer: Self-pay | Admitting: Internal Medicine

## 2023-08-19 DIAGNOSIS — Z1212 Encounter for screening for malignant neoplasm of rectum: Secondary | ICD-10-CM

## 2023-08-19 DIAGNOSIS — Z1211 Encounter for screening for malignant neoplasm of colon: Secondary | ICD-10-CM

## 2023-09-22 ENCOUNTER — Other Ambulatory Visit (HOSPITAL_COMMUNITY): Payer: Self-pay

## 2023-09-22 ENCOUNTER — Other Ambulatory Visit: Payer: Self-pay | Admitting: Internal Medicine

## 2023-09-22 ENCOUNTER — Other Ambulatory Visit: Payer: Self-pay

## 2023-09-22 DIAGNOSIS — E039 Hypothyroidism, unspecified: Secondary | ICD-10-CM

## 2023-09-22 MED ORDER — LEVOTHYROXINE SODIUM 25 MCG PO TABS
25.0000 ug | ORAL_TABLET | Freq: Every day | ORAL | 1 refills | Status: DC
  Filled 2023-09-22: qty 30, 30d supply, fill #0
  Filled 2023-11-03: qty 30, 30d supply, fill #1
  Filled 2023-12-12: qty 90, 90d supply, fill #2

## 2023-09-22 MED ORDER — AMLODIPINE BESYLATE 5 MG PO TABS
5.0000 mg | ORAL_TABLET | Freq: Every day | ORAL | 1 refills | Status: DC
  Filled 2023-09-22: qty 30, 30d supply, fill #0
  Filled 2023-11-03: qty 30, 30d supply, fill #1
  Filled 2023-12-12: qty 90, 90d supply, fill #2

## 2023-10-06 ENCOUNTER — Ambulatory Visit: Payer: 59 | Admitting: Internal Medicine

## 2023-11-03 ENCOUNTER — Other Ambulatory Visit (HOSPITAL_COMMUNITY): Payer: Self-pay

## 2023-11-21 ENCOUNTER — Ambulatory Visit: Payer: Self-pay | Admitting: Internal Medicine

## 2023-12-06 ENCOUNTER — Encounter (HOSPITAL_COMMUNITY): Payer: Self-pay

## 2023-12-06 ENCOUNTER — Encounter: Payer: Self-pay | Admitting: Internal Medicine

## 2023-12-06 ENCOUNTER — Other Ambulatory Visit (HOSPITAL_COMMUNITY): Payer: Self-pay

## 2023-12-06 ENCOUNTER — Ambulatory Visit: Payer: Self-pay | Admitting: Internal Medicine

## 2023-12-06 VITALS — BP 110/78 | HR 105 | Temp 97.8°F | Wt 209.3 lb

## 2023-12-06 DIAGNOSIS — E538 Deficiency of other specified B group vitamins: Secondary | ICD-10-CM

## 2023-12-06 DIAGNOSIS — Z1231 Encounter for screening mammogram for malignant neoplasm of breast: Secondary | ICD-10-CM

## 2023-12-06 DIAGNOSIS — R7302 Impaired glucose tolerance (oral): Secondary | ICD-10-CM

## 2023-12-06 DIAGNOSIS — I10 Essential (primary) hypertension: Secondary | ICD-10-CM

## 2023-12-06 DIAGNOSIS — E039 Hypothyroidism, unspecified: Secondary | ICD-10-CM

## 2023-12-06 DIAGNOSIS — E782 Mixed hyperlipidemia: Secondary | ICD-10-CM

## 2023-12-06 LAB — POCT GLYCOSYLATED HEMOGLOBIN (HGB A1C): Hemoglobin A1C: 6.1 % — AB (ref 4.0–5.6)

## 2023-12-06 MED ORDER — ZEPBOUND 2.5 MG/0.5ML ~~LOC~~ SOAJ
2.5000 mg | SUBCUTANEOUS | 0 refills | Status: DC
  Filled 2023-12-06: qty 2, 28d supply, fill #0

## 2023-12-06 NOTE — Progress Notes (Signed)
Established Patient Office Visit     CC/Reason for Visit: 36-month follow-up chronic medical conditions  HPI: Sharon Molina is a 53 y.o. female who is coming in today for the above mentioned reasons. Past Medical History is significant for: Hypertension, hyperlipidemia, impaired glucose tolerance, morbid obesity, generalized anxiety, seasonal allergies.  Feeling well.  She has gained an additional 7 pounds since her last visit.  Interested in considering GLP-1 medication.   Past Medical/Surgical History: Past Medical History:  Diagnosis Date   Anxiety    Hyperlipidemia    Hypertension    Morbid obesity (HCC)     Past Surgical History:  Procedure Laterality Date   CHOLECYSTECTOMY     MASTOIDECTOMY     tubes in ears      Social History:  reports that she has never smoked. She has never used smokeless tobacco. She reports that she does not drink alcohol and does not use drugs.  Allergies: Allergies  Allergen Reactions   Demerol    Lisinopril     Itching and swelling around eyes   Sulfa Antibiotics     Family History:  Family History  Problem Relation Age of Onset   Hypertension Father    Parkinsonism Father    Kidney cancer Father    Hypertension Mother    Cancer - Other Paternal Grandmother        brain   Heart disease Paternal Grandfather      Current Outpatient Medications:    amLODipine (NORVASC) 5 MG tablet, Take 1 tablet (5 mg total) by mouth daily., Disp: 90 tablet, Rfl: 1   atorvastatin (LIPITOR) 10 MG tablet, Take 1 tablet by mouth daily., Disp: 90 tablet, Rfl: 1   cetirizine (ZYRTEC) 10 MG tablet, Take 10 mg by mouth daily., Disp: , Rfl:    chlorthalidone (HYGROTON) 25 MG tablet, Take by mouth., Disp: , Rfl:    chlorthalidone (HYGROTON) 50 MG tablet, Take one-half tablet by mouth daily. May take 1 tablet if swelling., Disp: 90 tablet, Rfl: 1   Cholecalciferol (QC VITAMIN D3) 50 MCG (2000 UT) TABS, Take 1 tablet by mouth daily. , Disp: ,  Rfl:    escitalopram (LEXAPRO) 10 MG tablet, Take 1 tablet (10 mg total) by mouth daily., Disp: 90 tablet, Rfl: 1   levothyroxine (SYNTHROID) 25 MCG tablet, Take 1 tablet (25 mcg total) by mouth daily., Disp: 90 tablet, Rfl: 1   tirzepatide (ZEPBOUND) 2.5 MG/0.5ML injection vial, Inject 2.5 mg into the skin once a week., Disp: 2 mL, Rfl: 0  Review of Systems:  Negative unless indicated in HPI.   Physical Exam: Vitals:   12/06/23 0729  BP: 110/78  Pulse: (!) 105  Temp: 97.8 F (36.6 C)  TempSrc: Oral  SpO2: 98%  Weight: 209 lb 4.8 oz (94.9 kg)    Body mass index is 40.88 kg/m.   Physical Exam Vitals reviewed.  Constitutional:      Appearance: Normal appearance. She is obese.  HENT:     Head: Normocephalic and atraumatic.  Eyes:     Conjunctiva/sclera: Conjunctivae normal.     Pupils: Pupils are equal, round, and reactive to light.  Cardiovascular:     Rate and Rhythm: Normal rate and regular rhythm.  Pulmonary:     Effort: Pulmonary effort is normal.     Breath sounds: Normal breath sounds.  Skin:    General: Skin is warm and dry.  Neurological:     General: No focal deficit present.  Mental Status: She is alert and oriented to person, place, and time.  Psychiatric:        Mood and Affect: Mood normal.        Behavior: Behavior normal.        Thought Content: Thought content normal.        Judgment: Judgment normal.      Impression and Plan:  IGT (impaired glucose tolerance) -     POCT glycosylated hemoglobin (Hb A1C)  Encounter for screening mammogram for malignant neoplasm of breast -     Digital Screening Mammogram, Left and Right; Future  Morbid obesity (HCC) -     Tirzepatide-Weight Management; Inject 2.5 mg into the skin once a week.  Dispense: 2 mL; Refill: 0  Mixed hyperlipidemia  Primary hypertension  Hypothyroidism (acquired)  Vitamin B12 deficiency   -A1c is stable at 6.1 in the prediabetic range.  I will see if we can get her  qualified for Zepbound because of her prediabetic state, hypertension and morbid obesity. -Blood pressure is well-controlled on current. -She remains on atorvastatin 10 mg daily and tolerating well. -She remains on levothyroxine supplementation. -B12 supplementation remains. -Discussed healthy lifestyle, including increased physical activity and better food choices to promote weight loss.   Time spent:32 minutes reviewing chart, interviewing and examining patient and formulating plan of care.     Chaya Jan, MD [facility]

## 2023-12-09 ENCOUNTER — Other Ambulatory Visit (HOSPITAL_COMMUNITY): Payer: Self-pay

## 2023-12-12 ENCOUNTER — Other Ambulatory Visit (HOSPITAL_COMMUNITY): Payer: Self-pay

## 2023-12-12 ENCOUNTER — Other Ambulatory Visit: Payer: Self-pay

## 2024-01-23 ENCOUNTER — Other Ambulatory Visit: Payer: Self-pay | Admitting: Internal Medicine

## 2024-01-23 ENCOUNTER — Other Ambulatory Visit: Payer: Self-pay

## 2024-01-23 ENCOUNTER — Other Ambulatory Visit (HOSPITAL_COMMUNITY): Payer: Self-pay

## 2024-01-23 DIAGNOSIS — E785 Hyperlipidemia, unspecified: Secondary | ICD-10-CM

## 2024-01-23 MED ORDER — ESCITALOPRAM OXALATE 10 MG PO TABS
10.0000 mg | ORAL_TABLET | Freq: Every day | ORAL | 1 refills | Status: DC
  Filled 2024-01-23: qty 90, 90d supply, fill #0
  Filled 2024-06-04: qty 90, 90d supply, fill #1

## 2024-01-23 MED ORDER — ATORVASTATIN CALCIUM 10 MG PO TABS
10.0000 mg | ORAL_TABLET | Freq: Every day | ORAL | 1 refills | Status: DC
  Filled 2024-01-23: qty 90, 90d supply, fill #0
  Filled 2024-06-04: qty 90, 90d supply, fill #1

## 2024-04-11 ENCOUNTER — Other Ambulatory Visit: Payer: Self-pay

## 2024-04-11 ENCOUNTER — Other Ambulatory Visit (HOSPITAL_COMMUNITY): Payer: Self-pay

## 2024-04-11 ENCOUNTER — Other Ambulatory Visit: Payer: Self-pay | Admitting: Internal Medicine

## 2024-04-11 DIAGNOSIS — E039 Hypothyroidism, unspecified: Secondary | ICD-10-CM

## 2024-04-11 MED ORDER — LEVOTHYROXINE SODIUM 25 MCG PO TABS
25.0000 ug | ORAL_TABLET | Freq: Every day | ORAL | 0 refills | Status: DC
  Filled 2024-04-11: qty 90, 90d supply, fill #0

## 2024-04-11 MED ORDER — CHLORTHALIDONE 50 MG PO TABS
ORAL_TABLET | ORAL | 0 refills | Status: AC
  Filled 2024-04-11: qty 90, 90d supply, fill #0

## 2024-04-11 MED ORDER — AMLODIPINE BESYLATE 5 MG PO TABS
5.0000 mg | ORAL_TABLET | Freq: Every day | ORAL | 0 refills | Status: DC
  Filled 2024-04-11: qty 90, 90d supply, fill #0

## 2024-06-04 ENCOUNTER — Other Ambulatory Visit (HOSPITAL_COMMUNITY): Payer: Self-pay

## 2024-06-04 ENCOUNTER — Encounter: Payer: Managed Care, Other (non HMO) | Admitting: Internal Medicine

## 2024-06-04 DIAGNOSIS — R7302 Impaired glucose tolerance (oral): Secondary | ICD-10-CM

## 2024-06-04 DIAGNOSIS — E538 Deficiency of other specified B group vitamins: Secondary | ICD-10-CM

## 2024-06-04 DIAGNOSIS — E039 Hypothyroidism, unspecified: Secondary | ICD-10-CM

## 2024-06-04 DIAGNOSIS — E782 Mixed hyperlipidemia: Secondary | ICD-10-CM

## 2024-06-04 DIAGNOSIS — I10 Essential (primary) hypertension: Secondary | ICD-10-CM

## 2024-08-24 ENCOUNTER — Other Ambulatory Visit (HOSPITAL_COMMUNITY): Payer: Self-pay

## 2024-08-24 ENCOUNTER — Other Ambulatory Visit: Payer: Self-pay | Admitting: Internal Medicine

## 2024-08-24 DIAGNOSIS — E039 Hypothyroidism, unspecified: Secondary | ICD-10-CM

## 2024-08-27 ENCOUNTER — Other Ambulatory Visit (HOSPITAL_COMMUNITY): Payer: Self-pay

## 2024-08-27 ENCOUNTER — Other Ambulatory Visit: Payer: Self-pay

## 2024-08-27 MED ORDER — AMLODIPINE BESYLATE 5 MG PO TABS
5.0000 mg | ORAL_TABLET | Freq: Every day | ORAL | 0 refills | Status: AC
  Filled 2024-08-27: qty 90, 90d supply, fill #0

## 2024-08-27 MED ORDER — LEVOTHYROXINE SODIUM 25 MCG PO TABS
25.0000 ug | ORAL_TABLET | Freq: Every day | ORAL | 0 refills | Status: AC
  Filled 2024-08-27: qty 90, 90d supply, fill #0

## 2024-10-08 ENCOUNTER — Other Ambulatory Visit (HOSPITAL_COMMUNITY): Payer: Self-pay

## 2024-10-08 ENCOUNTER — Other Ambulatory Visit: Payer: Self-pay

## 2024-10-08 ENCOUNTER — Other Ambulatory Visit: Payer: Self-pay | Admitting: Internal Medicine

## 2024-10-08 DIAGNOSIS — E785 Hyperlipidemia, unspecified: Secondary | ICD-10-CM

## 2024-10-08 MED ORDER — ATORVASTATIN CALCIUM 10 MG PO TABS
10.0000 mg | ORAL_TABLET | Freq: Every day | ORAL | 0 refills | Status: AC
  Filled 2024-10-08: qty 90, 90d supply, fill #0

## 2024-10-08 MED ORDER — ESCITALOPRAM OXALATE 10 MG PO TABS
10.0000 mg | ORAL_TABLET | Freq: Every day | ORAL | 0 refills | Status: AC
  Filled 2024-10-08: qty 90, 90d supply, fill #0

## 2024-12-04 ENCOUNTER — Encounter (HOSPITAL_COMMUNITY): Payer: Self-pay

## 2024-12-04 ENCOUNTER — Encounter: Payer: Self-pay | Admitting: Internal Medicine

## 2024-12-04 ENCOUNTER — Other Ambulatory Visit (HOSPITAL_COMMUNITY): Payer: Self-pay

## 2024-12-04 ENCOUNTER — Ambulatory Visit: Admitting: Internal Medicine

## 2024-12-04 VITALS — BP 110/80 | HR 115 | Temp 98.2°F | Ht 60.0 in | Wt 214.1 lb

## 2024-12-04 DIAGNOSIS — E538 Deficiency of other specified B group vitamins: Secondary | ICD-10-CM | POA: Diagnosis not present

## 2024-12-04 DIAGNOSIS — I10 Essential (primary) hypertension: Secondary | ICD-10-CM | POA: Diagnosis not present

## 2024-12-04 DIAGNOSIS — E782 Mixed hyperlipidemia: Secondary | ICD-10-CM

## 2024-12-04 DIAGNOSIS — Z1211 Encounter for screening for malignant neoplasm of colon: Secondary | ICD-10-CM

## 2024-12-04 DIAGNOSIS — Z6841 Body Mass Index (BMI) 40.0 and over, adult: Secondary | ICD-10-CM

## 2024-12-04 DIAGNOSIS — Z23 Encounter for immunization: Secondary | ICD-10-CM | POA: Diagnosis not present

## 2024-12-04 DIAGNOSIS — R7989 Other specified abnormal findings of blood chemistry: Secondary | ICD-10-CM | POA: Diagnosis not present

## 2024-12-04 DIAGNOSIS — Z0000 Encounter for general adult medical examination without abnormal findings: Secondary | ICD-10-CM | POA: Diagnosis not present

## 2024-12-04 DIAGNOSIS — R7302 Impaired glucose tolerance (oral): Secondary | ICD-10-CM | POA: Diagnosis not present

## 2024-12-04 DIAGNOSIS — E785 Hyperlipidemia, unspecified: Secondary | ICD-10-CM

## 2024-12-04 DIAGNOSIS — Z1231 Encounter for screening mammogram for malignant neoplasm of breast: Secondary | ICD-10-CM

## 2024-12-04 DIAGNOSIS — E039 Hypothyroidism, unspecified: Secondary | ICD-10-CM

## 2024-12-04 MED ORDER — WEGOVY 1.5 MG PO TABS
1.5000 mg | ORAL_TABLET | Freq: Every day | ORAL | 0 refills | Status: AC
  Filled 2024-12-04: qty 30, 30d supply, fill #0

## 2024-12-04 NOTE — Progress Notes (Signed)
 "    Established Patient Office Visit     CC/Reason for Visit: Annual preventive exam  HPI: Sharon Molina is a 54 y.o. female who is coming in today for the above mentioned reasons.  Would like to try oral Wegovy  for her obesity.  Is otherwise doing well.  She is overdue for mammogram and colonoscopy.  Due for PCV 20.   Past Medical/Surgical History: Past Medical History:  Diagnosis Date   Anxiety    Hyperlipidemia    Hypertension    Morbid obesity (HCC)     Past Surgical History:  Procedure Laterality Date   CHOLECYSTECTOMY     MASTOIDECTOMY     tubes in ears      Social History:  reports that she has never smoked. She has never used smokeless tobacco. She reports that she does not drink alcohol and does not use drugs.  Allergies: Allergies[1]  Family History:  Family History  Problem Relation Age of Onset   Hypertension Father    Parkinsonism Father    Kidney cancer Father    Hypertension Mother    Cancer - Other Paternal Grandmother        brain   Heart disease Paternal Grandfather     Current Medications[2]  Review of Systems:  Negative unless indicated in HPI.   Physical Exam: Vitals:   12/04/24 1331  BP: 110/80  Pulse: (!) 115  Temp: 98.2 F (36.8 C)  TempSrc: Oral  SpO2: 97%  Weight: 214 lb 1.6 oz (97.1 kg)  Height: 5' (1.524 m)    Body mass index is 41.81 kg/m.   Physical Exam Vitals reviewed.  Constitutional:      General: She is not in acute distress.    Appearance: Normal appearance. She is obese. She is not ill-appearing, toxic-appearing or diaphoretic.  HENT:     Head: Normocephalic.     Right Ear: Tympanic membrane, ear canal and external ear normal. There is no impacted cerumen.     Left Ear: Tympanic membrane, ear canal and external ear normal. There is no impacted cerumen.     Nose: Nose normal.     Mouth/Throat:     Mouth: Mucous membranes are moist.     Pharynx: Oropharynx is clear. No oropharyngeal exudate  or posterior oropharyngeal erythema.  Eyes:     General: No scleral icterus.       Right eye: No discharge.        Left eye: No discharge.     Conjunctiva/sclera: Conjunctivae normal.     Pupils: Pupils are equal, round, and reactive to light.  Neck:     Vascular: No carotid bruit.  Cardiovascular:     Rate and Rhythm: Normal rate and regular rhythm.     Pulses: Normal pulses.     Heart sounds: Normal heart sounds.  Pulmonary:     Effort: Pulmonary effort is normal. No respiratory distress.     Breath sounds: Normal breath sounds.  Abdominal:     General: Abdomen is flat. Bowel sounds are normal.     Palpations: Abdomen is soft.  Musculoskeletal:        General: Normal range of motion.     Cervical back: Normal range of motion.  Skin:    General: Skin is warm and dry.  Neurological:     General: No focal deficit present.     Mental Status: She is alert and oriented to person, place, and time. Mental status is at baseline.  Psychiatric:  Mood and Affect: Mood normal.        Behavior: Behavior normal.        Thought Content: Thought content normal.        Judgment: Judgment normal.      Impression and Plan:  Encounter for preventive health examination  Hyperlipidemia, unspecified hyperlipidemia type  Hypothyroidism (acquired) -     TSH; Future  IGT (impaired glucose tolerance) -     Hemoglobin A1c; Future  Mixed hyperlipidemia -     Lipid panel; Future  Primary hypertension -     CBC with Differential/Platelet; Future -     Comprehensive metabolic panel with GFR; Future  Vitamin B12 deficiency -     Vitamin B12; Future -     VITAMIN D  25 Hydroxy (Vit-D Deficiency, Fractures); Future  Abnormal TSH  Encounter for screening mammogram for malignant neoplasm of breast -     Digital Screening Mammogram, Left and Right; Future  Screening for colon cancer -     Ambulatory referral to Gastroenterology  Morbid obesity (HCC) -     Wegovy ; Take 1 tablet (1.5  mg total) by mouth daily. Daily in AM on an empty stomach with 4 oz of water. Do not eat or drink for 30 minutes after dose.  Dispense: 30 tablet; Refill: 0  -Recommend routine eye and dental care. -Healthy lifestyle discussed in detail. -Labs to be updated today. -Prostate cancer screening: Not applicable Health Maintenance  Topic Date Due   Hepatitis B Vaccine (1 of 3 - 19+ 3-dose series) Never done   Cologuard (Stool DNA test)  Never done   Breast Cancer Screening  06/10/2017   COVID-19 Vaccine (5 - 2025-26 season) 07/16/2024   Pap with HPV screening  10/16/2027   DTaP/Tdap/Td vaccine (2 - Td or Tdap) 08/15/2030   Pneumococcal Vaccine for age over 56  Completed   Flu Shot  Completed   HPV Vaccine (No Doses Required) Completed   Hepatitis C Screening  Completed   HIV Screening  Completed   Zoster (Shingles) Vaccine  Completed   Meningitis B Vaccine  Aged Out      - PCV 20 given in office today. - C-scope and mammogram will be ordered. - She would like to try oral Wegovy  for obesity, I agree.  I have also discussed lifestyle changes.     Tully Theophilus Andrews, MD [facility]     [1]  Allergies Allergen Reactions   Demerol    Lisinopril     Itching and swelling around eyes   Sulfa Antibiotics   [2]  Current Outpatient Medications:    amLODipine  (NORVASC ) 5 MG tablet, Take 1 tablet (5 mg total) by mouth daily., Disp: 90 tablet, Rfl: 0   atorvastatin  (LIPITOR) 10 MG tablet, Take 1 tablet (10 mg total) by mouth daily., Disp: 90 tablet, Rfl: 0   cetirizine (ZYRTEC) 10 MG tablet, Take 10 mg by mouth daily., Disp: , Rfl:    chlorthalidone  (HYGROTON ) 25 MG tablet, Take by mouth., Disp: , Rfl:    chlorthalidone  (HYGROTON ) 50 MG tablet, Take one-half tablet by mouth daily. May take 1 tablet if swelling., Disp: 90 tablet, Rfl: 0   Cholecalciferol (QC VITAMIN D3) 50 MCG (2000 UT) TABS, Take 1 tablet by mouth daily. , Disp: , Rfl:    escitalopram   (LEXAPRO ) 10 MG tablet, Take 1 tablet (10 mg total) by mouth daily., Disp: 90 tablet, Rfl: 0   levothyroxine  (SYNTHROID ) 25 MCG tablet, Take 1  tablet (25 mcg total) by mouth daily., Disp: 90 tablet, Rfl: 0   semaglutide -weight management (WEGOVY ) 1.5 MG tablet, Take 1 tablet (1.5 mg total) by mouth daily. Daily in AM on an empty stomach with 4 oz of water. Do not eat or drink for 30 minutes after dose., Disp: 30 tablet, Rfl: 0  "

## 2024-12-07 ENCOUNTER — Other Ambulatory Visit (INDEPENDENT_AMBULATORY_CARE_PROVIDER_SITE_OTHER)

## 2024-12-07 DIAGNOSIS — E538 Deficiency of other specified B group vitamins: Secondary | ICD-10-CM

## 2024-12-07 DIAGNOSIS — I10 Essential (primary) hypertension: Secondary | ICD-10-CM | POA: Diagnosis not present

## 2024-12-07 DIAGNOSIS — E039 Hypothyroidism, unspecified: Secondary | ICD-10-CM | POA: Diagnosis not present

## 2024-12-07 DIAGNOSIS — R7302 Impaired glucose tolerance (oral): Secondary | ICD-10-CM | POA: Diagnosis not present

## 2024-12-07 DIAGNOSIS — E782 Mixed hyperlipidemia: Secondary | ICD-10-CM

## 2024-12-07 LAB — CBC WITH DIFFERENTIAL/PLATELET
Basophils Absolute: 0.1 K/uL (ref 0.0–0.1)
Basophils Relative: 0.8 % (ref 0.0–3.0)
Eosinophils Absolute: 0.3 K/uL (ref 0.0–0.7)
Eosinophils Relative: 4.3 % (ref 0.0–5.0)
HCT: 36.3 % (ref 36.0–46.0)
Hemoglobin: 12.5 g/dL (ref 12.0–15.0)
Lymphocytes Relative: 16.2 % (ref 12.0–46.0)
Lymphs Abs: 1.2 K/uL (ref 0.7–4.0)
MCHC: 34.3 g/dL (ref 30.0–36.0)
MCV: 84.4 fl (ref 78.0–100.0)
Monocytes Absolute: 0.5 K/uL (ref 0.1–1.0)
Monocytes Relative: 6.5 % (ref 3.0–12.0)
Neutro Abs: 5.2 K/uL (ref 1.4–7.7)
Neutrophils Relative %: 72.2 % (ref 43.0–77.0)
Platelets: 240 K/uL (ref 150.0–400.0)
RBC: 4.3 Mil/uL (ref 3.87–5.11)
RDW: 14.8 % (ref 11.5–15.5)
WBC: 7.2 K/uL (ref 4.0–10.5)

## 2024-12-07 LAB — LIPID PANEL
Cholesterol: 158 mg/dL (ref 28–200)
HDL: 49.1 mg/dL
LDL Cholesterol: 89 mg/dL (ref 10–99)
NonHDL: 109.14
Total CHOL/HDL Ratio: 3
Triglycerides: 99 mg/dL (ref 10.0–149.0)
VLDL: 19.8 mg/dL (ref 0.0–40.0)

## 2024-12-07 LAB — COMPREHENSIVE METABOLIC PANEL WITH GFR
ALT: 22 U/L (ref 3–35)
AST: 18 U/L (ref 5–37)
Albumin: 3.9 g/dL (ref 3.5–5.2)
Alkaline Phosphatase: 62 U/L (ref 39–117)
BUN: 10 mg/dL (ref 6–23)
CO2: 28 meq/L (ref 19–32)
Calcium: 9.5 mg/dL (ref 8.4–10.5)
Chloride: 99 meq/L (ref 96–112)
Creatinine, Ser: 0.73 mg/dL (ref 0.40–1.20)
GFR: 93.02 mL/min
Glucose, Bld: 103 mg/dL — ABNORMAL HIGH (ref 70–99)
Potassium: 3.9 meq/L (ref 3.5–5.1)
Sodium: 137 meq/L (ref 135–145)
Total Bilirubin: 0.4 mg/dL (ref 0.2–1.2)
Total Protein: 8 g/dL (ref 6.0–8.3)

## 2024-12-07 LAB — TSH: TSH: 3.18 u[IU]/mL (ref 0.35–5.50)

## 2024-12-07 LAB — VITAMIN D 25 HYDROXY (VIT D DEFICIENCY, FRACTURES): VITD: 31.26 ng/mL (ref 30.00–100.00)

## 2024-12-07 LAB — VITAMIN B12: Vitamin B-12: 767 pg/mL (ref 211–911)

## 2024-12-07 LAB — HEMOGLOBIN A1C: Hgb A1c MFr Bld: 6.3 % (ref 4.6–6.5)

## 2024-12-11 ENCOUNTER — Ambulatory Visit: Payer: Self-pay | Admitting: Internal Medicine

## 2024-12-14 ENCOUNTER — Other Ambulatory Visit (HOSPITAL_COMMUNITY): Payer: Self-pay

## 2025-01-04 ENCOUNTER — Ambulatory Visit

## 2025-03-05 ENCOUNTER — Ambulatory Visit: Admitting: Internal Medicine
